# Patient Record
Sex: Female | Born: 1970
Health system: Southern US, Community
[De-identification: ages and names within clinical notes are randomized; demographics above are authoritative.]

## PROBLEM LIST (undated history)

## (undated) DIAGNOSIS — T783XXA Angioneurotic edema, initial encounter: Secondary | ICD-10-CM

## (undated) DIAGNOSIS — L509 Urticaria, unspecified: Secondary | ICD-10-CM

## (undated) DIAGNOSIS — N3010 Interstitial cystitis (chronic) without hematuria: Secondary | ICD-10-CM

## (undated) DIAGNOSIS — M2242 Chondromalacia patellae, left knee: Secondary | ICD-10-CM

## (undated) DIAGNOSIS — J42 Unspecified chronic bronchitis: Secondary | ICD-10-CM

## (undated) DIAGNOSIS — J45909 Unspecified asthma, uncomplicated: Secondary | ICD-10-CM

## (undated) DIAGNOSIS — E7800 Pure hypercholesterolemia, unspecified: Secondary | ICD-10-CM

## (undated) DIAGNOSIS — R011 Cardiac murmur, unspecified: Secondary | ICD-10-CM

## (undated) DIAGNOSIS — M2241 Chondromalacia patellae, right knee: Secondary | ICD-10-CM

## (undated) DIAGNOSIS — G43909 Migraine, unspecified, not intractable, without status migrainosus: Secondary | ICD-10-CM

## (undated) HISTORY — PX: APPENDECTOMY: SHX54

## (undated) HISTORY — DX: Pure hypercholesterolemia, unspecified: E78.00

## (undated) HISTORY — DX: Chondromalacia patellae, left knee: M22.41

## (undated) HISTORY — PX: AUGMENTATION MAMMAPLASTY: SUR837

## (undated) HISTORY — DX: Interstitial cystitis (chronic) without hematuria: N30.10

## (undated) HISTORY — PX: TONSILLECTOMY: SUR1361

## (undated) HISTORY — PX: SINOSCOPY: SHX187

## (undated) HISTORY — DX: Chondromalacia patellae, left knee: M22.42

## (undated) HISTORY — DX: Angioneurotic edema, initial encounter: T78.3XXA

## (undated) HISTORY — DX: Urticaria, unspecified: L50.9

---

## 1999-05-30 HISTORY — PX: ABDOMINAL HYSTERECTOMY: SHX81

## 2010-05-29 HISTORY — PX: NASAL SEPTUM SURGERY: SHX37

## 2016-02-08 ENCOUNTER — Ambulatory Visit (INDEPENDENT_AMBULATORY_CARE_PROVIDER_SITE_OTHER): Payer: BLUE CROSS/BLUE SHIELD | Admitting: Allergy

## 2016-02-08 ENCOUNTER — Encounter: Payer: Self-pay | Admitting: Allergy

## 2016-02-08 VITALS — BP 130/82 | HR 80 | Temp 98.9°F | Resp 18 | Ht 66.14 in | Wt 145.6 lb

## 2016-02-08 DIAGNOSIS — T781XXA Other adverse food reactions, not elsewhere classified, initial encounter: Secondary | ICD-10-CM | POA: Diagnosis not present

## 2016-02-08 DIAGNOSIS — J309 Allergic rhinitis, unspecified: Secondary | ICD-10-CM

## 2016-02-08 DIAGNOSIS — H1010 Acute atopic conjunctivitis, unspecified eye: Secondary | ICD-10-CM

## 2016-02-08 DIAGNOSIS — L509 Urticaria, unspecified: Secondary | ICD-10-CM | POA: Diagnosis not present

## 2016-02-08 DIAGNOSIS — T783XXA Angioneurotic edema, initial encounter: Secondary | ICD-10-CM

## 2016-02-08 MED ORDER — FEXOFENADINE HCL 180 MG PO TABS: 180.0000 mg | ORAL_TABLET | Freq: Every day | ORAL | 5 refills | Status: AC

## 2016-02-08 NOTE — Patient Instructions (Addendum)
Hives and Swelling   - will obtain following labs: hive panel, tryptase, CBC w diff, CMP  - please let us know if you have the following with your hives/swelling:  Leaves marks/bruises, fevers, joint aches/pains  - start Allegra 180 mg daily  - if you still have hives with daily Allegra would add Zantac 150mg  daily.  You may take this regimen twice a day if hives/swelling are not improving  Seasonal allergies  - will obtain environmental allergen panel and total IgE  - continue as needed use of Allegra  - may benefit from nasal spray or allergy eye drop  Oral pollen food syndrome   - continue avoidance of cantaloupe, watermelon and bananas  - will obtain IgE levels for the above  Possible contact dermatitis  - maybe from shampoo  - would recommend patch testing in future to evaluate  Follow-up 2-3 months

## 2016-02-08 NOTE — Progress Notes (Signed)
New Patient Note  RE: Christine Guerrero MRN: [id number] DOB: 1970/06/16 Date of Office Visit: 02/08/2016  Referring provider: No ref. provider found Primary care provider: Bernita Raisin., MD  Chief Complaint: allergic reaction  History of present illness: Christine Guerrero is a 45 y.o. female presenting today for consultation for allergic reaction. She was referred to see allergy after her recent ED visit (records not available in EMR).   She has been having hives and swelling.  She reports eating cookie batter and brownie batter on evening of Aug 25th and her lip started itching and swelling.  She took a benadryl and it improved.  She then had another incident on Aug 28th where she used a shampoo that she has used on many occasions and her scalp started itching.  She states her scalp itched for 2 days and on the 3rd day she started having hives and then in the evening she felt her mouth was itchy and then started swelling.  She did note her throat feeling scratchy.  She went to ED where she was treated with steroids, benadryl and discharged with 10 days of steroids.  She was still having episodes of hives while on steroids but reports the hives were not as bad.   With both episodes she denies any difficulty breathing, nausea or vomiting and no lightheadedness or syncope.  She had never had hives before.  She denies any preceding illnesses, sneezing, new medications or dosage changes, change in lotion/soaps/detergents.  She also denies any new foods. She reports that the hives for itchy, red and raised and will likely well. The holidays did not leave any marks or bruising. They resolved within 24 hours. She denies any joint pain or arthralgias.  She reports with certain fruits her throat itches: cantaloupe, watermelon, bananas.  The she avoids these foods.  She has interstitial cystitis and reports she is suppose to limit her fruit ingestion.   She reports runny nose and congestion, itchy  watery eyes and sneezing with spring being worse followed by fall.  She usually will take allegra D.    Had childhood asthma that she outgrew. Denies any history of eczema or drug allergy.  Review of systems: Review of Systems  Constitutional: Negative for chills and fever.  HENT: Negative for congestion and sore throat.   Eyes: Negative for redness.  Respiratory: Negative for cough, shortness of breath and wheezing.   Cardiovascular: Negative for chest pain.  Gastrointestinal: Negative for heartburn, nausea and vomiting.  Skin: Positive for itching and rash.  Neurological: Negative for headaches.    All other systems negative unless noted above in HPI  Past medical history: Past Medical History:  Diagnosis Date  . Angio-edema   . Bursitis of knee   . Chondromalacia of both patellae   . High cholesterol   . Interstitial cystitis   . Urticaria     Past surgical history: Past Surgical History:  Procedure Laterality Date  . ABDOMINAL HYSTERECTOMY  2001  . NASAL SEPTUM SURGERY  2012  . SINOSCOPY    . TONSILLECTOMY      Family history:  Family History  Problem Relation Age of Onset  . Melanoma Mother   . High blood pressure Mother   . High Cholesterol Mother   . Asthma Father   . Lung cancer Father   . Heart disease Father   . High blood pressure Father   . Diabetes Father   . Allergies Father     Seasonal  .  Crohn's disease Maternal Grandfather   . Lung cancer Paternal Grandfather   . Diabetes Paternal Grandfather     Social history: . She is married and lives in a home with carpeting in the family. Home has electric heating and central cooling. There is one dog in the home. She is a Futures traderhomemaker. She denies any tobacco use.     Medication List:   Medication List       Accurate as of 02/08/16 11:54 AM. Always use your most recent med list.          atorvastatin 10 MG tablet Commonly known as:  LIPITOR Take 1 tablet by mouth every other day.     diclofenac 50 MG tablet Commonly known as:  CATAFLAM Take by mouth.   estradiol 1 MG tablet Commonly known as:  ESTRACE Take 1 tablet by mouth daily.   KRILL OIL PO Take 1 capsule by mouth daily.   sertraline 100 MG tablet Commonly known as:  ZOLOFT Take 50 mg by mouth daily.       Known medication allergies: Allergies no known allergies   Physical examination: Blood pressure 130/82, pulse 80, temperature 98.9 F (37.2 C), temperature source Oral, resp. rate 18, height 5' 6.14" (1.68 m), weight 145 lb 9.6 oz (66 kg).   General: Alert, interactive, in no acute distress. HEENT: TMs pearly gray, turbinates minimally edematous without discharge, post-pharynx non erythematous. Neck: Supple without lymphadenopathy. Lungs: Clear to auscultation without wheezing, rhonchi or rales. {no increased work of breathing. CV: Normal S1, S2 without murmurs. Abdomen: Nondistended, nontender. Skin: Warm and dry, without lesions or rashes. No urticarial lesions or swelling. Extremities:  No clubbing, cyanosis or edema. Neuro:   Grossly intact.  Diagnositics/Labs: Allergy testing: Deferred due to recent hives and swelling within the past 6 weeks    Assessment and plan:   Urticaria with angioedema  - Likely idiopathic. Given that she eats egg and dairy products on a routine basis without any problem do not feel she was reacting to the cookie or brownie batters.    - will obtain following labs: hive panel, tryptase, CBC w diff, CMP  - please let us know if you have the following with your hives/swelling:  Leaves marks/bruises, fevers, joint aches/pains  - start Allegra 180 mg daily.    If she continues to have hives advised that she add on Zantac 150 mg daily. If she continues to have hives despite this regimen can increase both to twice a day dosing.    Allergic rhinoconjunctivitis  - will obtain environmental allergen panel and total IgE  - continue as needed use of Allegra  - may  benefit from nasal spray or allergy eye drop however she would like to wait to try these  Oral pollen food syndrome   - continue avoidance of cantaloupe, watermelon and bananas  - Suspect she has pollen allergy  - will obtain IgE levels for the fruits above  Possible contact dermatitis  - maybe from shampoo  - would recommend patch testing in future to evaluate off any steroid use  Follow-up 2-3 months  I appreciate the opportunity to take part in Christine Guerrero's care. Please do not hesitate to contact me with questions.  Sincerely,   Margo AyeShaylar Jeaneen Cala, MD Allergy/Immunology [facility]

## 2016-02-15 LAB — CBC WITH DIFFERENTIAL/PLATELET
BASOS ABS: 0 10*3/uL (ref 0.0–0.2)
BASOS: 0 %
EOS (ABSOLUTE): 0.2 10*3/uL (ref 0.0–0.4)
Eos: 3 %
Hematocrit: 38.7 % (ref 34.0–46.6)
Hemoglobin: 12.8 g/dL (ref 11.1–15.9)
IMMATURE GRANS (ABS): 0 10*3/uL (ref 0.0–0.1)
IMMATURE GRANULOCYTES: 0 %
LYMPHS: 37 %
Lymphocytes Absolute: 2 10*3/uL (ref 0.7–3.1)
MCH: 29.3 pg (ref 26.6–33.0)
MCHC: 33.1 g/dL (ref 31.5–35.7)
MCV: 89 fL (ref 79–97)
MONOS ABS: 0.3 10*3/uL (ref 0.1–0.9)
Monocytes: 6 %
NEUTROS PCT: 54 %
Neutrophils Absolute: 2.8 10*3/uL (ref 1.4–7.0)
PLATELETS: 279 10*3/uL (ref 150–379)
RBC: 4.37 x10E6/uL (ref 3.77–5.28)
RDW: 13.1 % (ref 12.3–15.4)
WBC: 5.3 10*3/uL (ref 3.4–10.8)

## 2016-02-15 LAB — ALLERGENS W/TOTAL IGE AREA 2
Bermuda Grass IgE: 0.48 kU/L — AB
Cat Dander IgE: 1.31 kU/L — AB
Cockroach, German IgE: <0.1 kU/L
Cottonwood IgE: 0.31 kU/L — AB
D Farinae IgE: 2.44 kU/L — AB
D001-IGE D PTERONYSSINUS: 2.76 kU/L — AB
Dog Dander IgE: 5.03 kU/L — AB
Elm, American IgE: 0.42 kU/L — AB
G010-IGE JOHNSON GRASS: 1 kU/L — AB
IGE (IMMUNOGLOBULIN E), SERUM: 157 [IU]/mL — AB (ref 0–100)
M002-IGE CLADOSPORIUM HERBARUM: 0.29 kU/L — AB
M003-IGE ASPERGILLUS FUMIGATUS: 0.63 kU/L — AB
M006-IGE ALTERNARIA ALTERNATA: 5.1 kU/L — AB
Oak, White IgE: 0.26 kU/L — AB
Pecan, Hickory IgE: 0.15 kU/L — AB
Penicillium Chrysogen IgE: 0.23 kU/L — AB
T001-IGE MAPLE/BOX ELDER: 0.19 kU/L — AB
T003-IGE COMMON SILVER BIRCH: 0.19 kU/L — AB
T006-IGE CEDAR, MOUNTAIN: 0.31 kU/L — AB
Timothy Grass IgE: 4.18 kU/L — AB
W001-IGE RAGWEED, SHORT: 0.17 kU/L — AB
W014-IGE PIGWEED, ROUGH: 0.48 kU/L — AB

## 2016-02-15 LAB — F087-IGE MELON

## 2016-02-15 LAB — COMPREHENSIVE METABOLIC PANEL
A/G RATIO: 1.9 (ref 1.2–2.2)
ALT: 39 IU/L — AB (ref 0–32)
AST: 24 IU/L (ref 0–40)
Albumin: 4.5 g/dL (ref 3.5–5.5)
Alkaline Phosphatase: 65 IU/L (ref 39–117)
BILIRUBIN TOTAL: 0.8 mg/dL (ref 0.0–1.2)
BUN/Creatinine Ratio: 21 (ref 9–23)
BUN: 15 mg/dL (ref 6–24)
CALCIUM: 9.5 mg/dL (ref 8.7–10.2)
CHLORIDE: 101 mmol/L (ref 96–106)
CO2: 24 mmol/L (ref 18–29)
Creatinine, Ser: 0.72 mg/dL (ref 0.57–1.00)
GFR, EST AFRICAN AMERICAN: 118 mL/min/{1.73_m2} (ref >59)
GFR, EST NON AFRICAN AMERICAN: 102 mL/min/{1.73_m2} (ref >59)
GLUCOSE: 92 mg/dL (ref 65–99)
Globulin, Total: 2.4 g/dL (ref 1.5–4.5)
POTASSIUM: 4.1 mmol/L (ref 3.5–5.2)
Sodium: 140 mmol/L (ref 134–144)
TOTAL PROTEIN: 6.9 g/dL (ref 6.0–8.5)

## 2016-02-15 LAB — ALLERGEN BANANA: Allergen Banana IgE: 0.21 kU/L — AB

## 2016-02-15 LAB — ALLERGEN WATERMELON: Allergen Watermelon IgE: <0.1 kU/L

## 2016-02-15 LAB — CHRONIC URTICARIA: CU INDEX: 4.5 (ref <10)

## 2016-02-15 LAB — TRYPTASE: TRYPTASE: 4.4 ug/L (ref 2.2–13.2)

## 2016-03-14 DIAGNOSIS — J0100 Acute maxillary sinusitis, unspecified: Secondary | ICD-10-CM | POA: Diagnosis not present

## 2016-03-14 DIAGNOSIS — J302 Other seasonal allergic rhinitis: Secondary | ICD-10-CM | POA: Diagnosis not present

## 2016-04-11 ENCOUNTER — Ambulatory Visit: Payer: Self-pay | Admitting: Allergy

## 2016-10-30 DIAGNOSIS — H52222 Regular astigmatism, left eye: Secondary | ICD-10-CM | POA: Diagnosis not present

## 2016-11-01 DIAGNOSIS — J0100 Acute maxillary sinusitis, unspecified: Secondary | ICD-10-CM | POA: Diagnosis not present

## 2016-11-28 DIAGNOSIS — Z6822 Body mass index (BMI) 22.0-22.9, adult: Secondary | ICD-10-CM | POA: Diagnosis not present

## 2016-11-28 DIAGNOSIS — Z1211 Encounter for screening for malignant neoplasm of colon: Secondary | ICD-10-CM | POA: Diagnosis not present

## 2016-11-28 DIAGNOSIS — Z01419 Encounter for gynecological examination (general) (routine) without abnormal findings: Secondary | ICD-10-CM | POA: Diagnosis not present

## 2016-12-08 DIAGNOSIS — E7800 Pure hypercholesterolemia, unspecified: Secondary | ICD-10-CM | POA: Diagnosis not present

## 2017-01-16 ENCOUNTER — Inpatient Hospital Stay (HOSPITAL_COMMUNITY)
Admission: EM | Admit: 2017-01-16 | Discharge: 2017-01-25 | DRG: 165 | Disposition: A | Discharge status: Home / Self Care | Payer: BLUE CROSS/BLUE SHIELD | Attending: Cardiothoracic Surgery | Admitting: Cardiothoracic Surgery

## 2017-01-16 ENCOUNTER — Emergency Department (HOSPITAL_COMMUNITY): Payer: BLUE CROSS/BLUE SHIELD

## 2017-01-16 ENCOUNTER — Encounter (HOSPITAL_COMMUNITY): Payer: Self-pay

## 2017-01-16 DIAGNOSIS — E7800 Pure hypercholesterolemia, unspecified: Secondary | ICD-10-CM | POA: Diagnosis not present

## 2017-01-16 DIAGNOSIS — Z885 Allergy status to narcotic agent status: Secondary | ICD-10-CM

## 2017-01-16 DIAGNOSIS — Z4682 Encounter for fitting and adjustment of non-vascular catheter: Secondary | ICD-10-CM | POA: Diagnosis not present

## 2017-01-16 DIAGNOSIS — J9383 Other pneumothorax: Secondary | ICD-10-CM

## 2017-01-16 DIAGNOSIS — R0602 Shortness of breath: Secondary | ICD-10-CM | POA: Diagnosis not present

## 2017-01-16 DIAGNOSIS — T783XXA Angioneurotic edema, initial encounter: Secondary | ICD-10-CM | POA: Diagnosis not present

## 2017-01-16 DIAGNOSIS — Z833 Family history of diabetes mellitus: Secondary | ICD-10-CM | POA: Diagnosis not present

## 2017-01-16 DIAGNOSIS — T8182XA Emphysema (subcutaneous) resulting from a procedure, initial encounter: Secondary | ICD-10-CM | POA: Diagnosis not present

## 2017-01-16 DIAGNOSIS — J939 Pneumothorax, unspecified: Secondary | ICD-10-CM

## 2017-01-16 DIAGNOSIS — R51 Headache: Secondary | ICD-10-CM | POA: Diagnosis not present

## 2017-01-16 DIAGNOSIS — Z09 Encounter for follow-up examination after completed treatment for conditions other than malignant neoplasm: Secondary | ICD-10-CM

## 2017-01-16 DIAGNOSIS — Z79899 Other long term (current) drug therapy: Secondary | ICD-10-CM

## 2017-01-16 DIAGNOSIS — Z9689 Presence of other specified functional implants: Secondary | ICD-10-CM

## 2017-01-16 DIAGNOSIS — Z90710 Acquired absence of both cervix and uterus: Secondary | ICD-10-CM

## 2017-01-16 DIAGNOSIS — R279 Unspecified lack of coordination: Secondary | ICD-10-CM | POA: Diagnosis not present

## 2017-01-16 DIAGNOSIS — Z419 Encounter for procedure for purposes other than remedying health state, unspecified: Secondary | ICD-10-CM

## 2017-01-16 DIAGNOSIS — J9311 Primary spontaneous pneumothorax: Secondary | ICD-10-CM | POA: Diagnosis not present

## 2017-01-16 DIAGNOSIS — Z8249 Family history of ischemic heart disease and other diseases of the circulatory system: Secondary | ICD-10-CM

## 2017-01-16 DIAGNOSIS — N3010 Interstitial cystitis (chronic) without hematuria: Secondary | ICD-10-CM | POA: Diagnosis not present

## 2017-01-16 DIAGNOSIS — Y838 Other surgical procedures as the cause of abnormal reaction of the patient, or of later complication, without mention of misadventure at the time of the procedure: Secondary | ICD-10-CM | POA: Diagnosis not present

## 2017-01-16 DIAGNOSIS — J45998 Other asthma: Secondary | ICD-10-CM | POA: Diagnosis not present

## 2017-01-16 DIAGNOSIS — J9382 Other air leak: Secondary | ICD-10-CM | POA: Diagnosis present

## 2017-01-16 DIAGNOSIS — Z743 Need for continuous supervision: Secondary | ICD-10-CM | POA: Diagnosis not present

## 2017-01-16 DIAGNOSIS — L509 Urticaria, unspecified: Secondary | ICD-10-CM | POA: Diagnosis not present

## 2017-01-16 DIAGNOSIS — J439 Emphysema, unspecified: Secondary | ICD-10-CM | POA: Diagnosis not present

## 2017-01-16 HISTORY — DX: Cardiac murmur, unspecified: R01.1

## 2017-01-16 HISTORY — PX: CHEST TUBE INSERTION: SHX231

## 2017-01-16 HISTORY — DX: Unspecified chronic bronchitis: J42

## 2017-01-16 HISTORY — DX: Migraine, unspecified, not intractable, without status migrainosus: G43.909

## 2017-01-16 HISTORY — DX: Unspecified asthma, uncomplicated: J45.909

## 2017-01-16 LAB — CBC
HCT: 39.2 % (ref 36.0–46.0)
HEMOGLOBIN: 13.2 g/dL (ref 12.0–15.0)
MCH: 28.9 pg (ref 26.0–34.0)
MCHC: 33.7 g/dL (ref 30.0–36.0)
MCV: 85.8 fL (ref 78.0–100.0)
Platelets: 236 10*3/uL (ref 150–400)
RBC: 4.57 MIL/uL (ref 3.87–5.11)
RDW: 13.3 % (ref 11.5–15.5)
WBC: 7 10*3/uL (ref 4.0–10.5)

## 2017-01-16 LAB — COMPREHENSIVE METABOLIC PANEL
ALBUMIN: 3.9 g/dL (ref 3.5–5.0)
ALK PHOS: 59 U/L (ref 38–126)
ALT: 22 U/L (ref 14–54)
ANION GAP: 8 (ref 5–15)
AST: 24 U/L (ref 15–41)
BUN: 8 mg/dL (ref 6–20)
CALCIUM: 8.6 mg/dL — AB (ref 8.9–10.3)
CHLORIDE: 108 mmol/L (ref 101–111)
CO2: 22 mmol/L (ref 22–32)
Creatinine, Ser: 0.67 mg/dL (ref 0.44–1.00)
GFR calc Af Amer: >60 mL/min (ref >60)
GFR calc non Af Amer: >60 mL/min (ref >60)
GLUCOSE: 150 mg/dL — AB (ref 65–99)
Potassium: 3.9 mmol/L (ref 3.5–5.1)
SODIUM: 138 mmol/L (ref 135–145)
Total Bilirubin: 0.6 mg/dL (ref 0.3–1.2)
Total Protein: 6.5 g/dL (ref 6.5–8.1)

## 2017-01-16 MED ORDER — DEXTROSE-NACL 5-0.9 % IV SOLN
INTRAVENOUS | Status: DC
  Administered 2017-01-16 – 2017-01-18 (×2): via INTRAVENOUS

## 2017-01-16 MED ORDER — ALBUTEROL SULFATE (2.5 MG/3ML) 0.083% IN NEBU
2.5000 mg | INHALATION_SOLUTION | Freq: Four times a day (QID) | RESPIRATORY_TRACT | Status: DC
  Administered 2017-01-16 – 2017-01-17 (×3): 2.5 mg via RESPIRATORY_TRACT
  Filled 2017-01-16 (×3): qty 3

## 2017-01-16 MED ORDER — ALBUTEROL SULFATE (2.5 MG/3ML) 0.083% IN NEBU: 2.5000 mg | INHALATION_SOLUTION | RESPIRATORY_TRACT | Status: DC | PRN

## 2017-01-16 MED ORDER — HYDROMORPHONE HCL 1 MG/ML IJ SOLN
1.0000 mg | Freq: Once | INTRAMUSCULAR | Status: AC
  Administered 2017-01-16: 1 mg via INTRAVENOUS
  Filled 2017-01-16: qty 1

## 2017-01-16 MED ORDER — SORBITOL 70 % SOLN: 30.0000 mL | Freq: Every day | Status: DC | PRN

## 2017-01-16 MED ORDER — DOCUSATE SODIUM 100 MG PO CAPS
100.0000 mg | ORAL_CAPSULE | Freq: Two times a day (BID) | ORAL | Status: DC
  Administered 2017-01-16 – 2017-01-21 (×9): 100 mg via ORAL
  Filled 2017-01-16 (×9): qty 1

## 2017-01-16 MED ORDER — HYDROMORPHONE HCL 1 MG/ML IJ SOLN
1.0000 mg | INTRAMUSCULAR | Status: DC | PRN
  Administered 2017-01-17 – 2017-01-19 (×7): 1 mg via INTRAVENOUS
  Filled 2017-01-16 (×7): qty 1

## 2017-01-16 MED ORDER — GUAIFENESIN ER 600 MG PO TB12
600.0000 mg | ORAL_TABLET | Freq: Two times a day (BID) | ORAL | Status: DC
  Administered 2017-01-16 – 2017-01-25 (×16): 600 mg via ORAL
  Filled 2017-01-16 (×16): qty 1

## 2017-01-16 MED ORDER — ENOXAPARIN SODIUM 40 MG/0.4ML ~~LOC~~ SOLN
40.0000 mg | SUBCUTANEOUS | Status: DC
Start: 2017-01-17 — End: 2017-01-22
  Administered 2017-01-17 – 2017-01-20 (×3): 40 mg via SUBCUTANEOUS
  Filled 2017-01-16 (×3): qty 0.4

## 2017-01-16 MED ORDER — KETOROLAC TROMETHAMINE 15 MG/ML IJ SOLN
15.0000 mg | Freq: Four times a day (QID) | INTRAMUSCULAR | Status: AC
  Administered 2017-01-16 – 2017-01-17 (×4): 15 mg via INTRAVENOUS
  Filled 2017-01-16 (×4): qty 1

## 2017-01-16 MED ORDER — TRAMADOL HCL 50 MG PO TABS
50.0000 mg | ORAL_TABLET | Freq: Four times a day (QID) | ORAL | Status: DC | PRN
  Administered 2017-01-16 – 2017-01-21 (×8): 50 mg via ORAL
  Filled 2017-01-16 (×9): qty 1

## 2017-01-16 MED ORDER — SODIUM CHLORIDE 0.9% FLUSH
3.0000 mL | Freq: Two times a day (BID) | INTRAVENOUS | Status: DC
  Administered 2017-01-16 – 2017-01-21 (×8): 3 mL via INTRAVENOUS

## 2017-01-16 MED ORDER — ASPIRIN EC 81 MG PO TBEC
81.0000 mg | DELAYED_RELEASE_TABLET | Freq: Every day | ORAL | Status: DC
  Administered 2017-01-16 – 2017-01-25 (×8): 81 mg via ORAL
  Filled 2017-01-16 (×8): qty 1

## 2017-01-16 NOTE — Consult Note (Signed)
[address]       [place] [address]             [phone]        Yanieliz Summerlin [facility] Medical Record #[id number] Date of Birth: 10/28/1970  Referring: No ref. provider found Primary Care: Bernita Raisin., MD  Chief Complaint:    Chief Complaint  Patient presents with  . Tension Pneumothorax  patient examined, chest x-ray images taken today all personally reviewed.  History of Present Illness:     46 year old Caucasian female nonsmoker who presented to [facility] ER with several days of sharp right sided chest pain and shortness of breath. She is found have a 50% spontaneous right pneumothorax. She had been having problems with asthmatic bronchitis with wheezing and cough the preceding days. No falls or trauma to the chest. This is her first episode of spontaneous pneumothorax.  A chest tube was placed in the emergency department by a surgeon. This was repositioned at least once and on x-ray taken at this hospital  the right lung is 90% reexpanded. The tube is secure, connected to a Pleur-evac, and there is an air leak present upon coughing.   Current Activity/ Functional Status: Married, normal activities   Zubrod Score: At the time of surgery this patient's most appropriate activity status/level should be described as: []     0    Normal activity, no symptoms []     1    Restricted in physical strenuous activity but ambulatory, able to do out light work [x]     2    Ambulatory and capable of self care, unable to do work activities, up and about                 more than 50%  Of the time                            []     3    Only limited self care, in bed greater than 50% of waking hours []     4    Completely disabled, no self care, confined to bed or chair []     5    Moribund  Past Medical History:  Diagnosis Date  . Angio-edema   . Bursitis of knee   . Chondromalacia of both patellae   . High cholesterol   . Interstitial cystitis    . Urticaria     Past Surgical History:  Procedure Laterality Date  . ABDOMINAL HYSTERECTOMY  2001  . NASAL SEPTUM SURGERY  2012  . SINOSCOPY    . TONSILLECTOMY      History  Smoking Status  . Never Smoker  Smokeless Tobacco  . Never Used   History  Alcohol Use No    Social History   Social History  . Marital status: Married    Spouse name: N/A  . Number of children: N/A  . Years of education: N/A   Occupational History  . Not on file.   Social History Main Topics  . Smoking status: Never Smoker  . Smokeless tobacco: Never Used  . Alcohol use No  . Drug use: No  . Sexual activity: Not on file   Other Topics Concern  . Not on file   Social History Narrative  . No narrative on file    Allergies  Allergen Reactions  . Codeine Nausea And Vomiting    Current Facility-Administered  Medications  Medication Dose Route Frequency Provider Last Rate Last Dose  . albuterol (PROVENTIL) (2.5 MG/3ML) 0.083% nebulizer solution 2.5 mg  2.5 mg Nebulization Q6H Donata Clay, Theron Arista, MD      . albuterol (PROVENTIL) (2.5 MG/3ML) 0.083% nebulizer solution 2.5 mg  2.5 mg Nebulization Q2H PRN Kerin Perna, MD      . aspirin EC tablet 81 mg  81 mg Oral Daily Donata Clay, Theron Arista, MD      . dextrose 5 %-0.9 % sodium chloride infusion   Intravenous Continuous Donata Clay, Theron Arista, MD      . docusate sodium (COLACE) capsule 100 mg  100 mg Oral BID Donata Clay, Theron Arista, MD      . Melene Muller ON 01/17/2017] enoxaparin (LOVENOX) injection 40 mg  40 mg Subcutaneous Q24H Donata Clay, Theron Arista, MD      . guaiFENesin [facility]) 12 hr tablet 600 mg  600 mg Oral BID Donata Clay, Theron Arista, MD      . HYDROmorphone (DILAUDID) injection 1 mg  1 mg Intravenous Q2H PRN Donata Clay, Theron Arista, MD      . ketorolac (TORADOL) 15 MG/ML injection 15 mg  15 mg Intravenous Q6H Donata Clay, Theron Arista, MD      . sodium chloride flush (NS) 0.9 % injection 3 mL  3 mL Intravenous Q12H Donata Clay, Theron Arista, MD      . sorbitol 70 % solution 30 mL  30 mL  Oral Daily PRN Donata Clay, Theron Arista, MD      . traMADol Janean Sark) tablet 50 mg  50 mg Oral Q6H PRN Kerin Perna, MD       Current Outpatient Prescriptions  Medication Sig Dispense Refill  . diclofenac (CATAFLAM) 50 MG tablet Take 50 mg by mouth daily.     Marland Kitchen estradiol (ESTRACE) 1 MG tablet Take 1 tablet by mouth daily.    . fexofenadine (ALLEGRA) 180 MG tablet Take 1 tablet (180 mg total) by mouth daily. 30 tablet 5  . omega-3 acid ethyl esters (LOVAZA) 1 g capsule Take 1 g by mouth 2 (two) times daily.    Marland Kitchen atorvastatin (LIPITOR) 10 MG tablet Take 1 tablet by mouth every other day.  5     (Not in a hospital admission)  Family History  Problem Relation Age of Onset  . Melanoma Mother   . High blood pressure Mother   . High Cholesterol Mother   . Asthma Father   . Lung cancer Father   . Heart disease Father   . High blood pressure Father   . Diabetes Father   . Allergies Father        Seasonal  . Crohn's disease Maternal Grandfather   . Lung cancer Paternal Grandfather   . Diabetes Paternal Grandfather      Review of Systems:       Cardiac Review of Systems: Y or N  Chest Pain [ y   ]  Resting SOB Cove.Etienne   ] Exertional SOB  Cove.Etienne  ]  Orthopnea [ y ]   Pedal Edema [ n  ]    Palpitations [n  ] Syncope  Milo.Brash  ]   Presyncope [ n  ]  General Review of Systems: [Y] = yes [  ]=no Constitional: recent weight change [  ]; anorexia [  ]; fatigue Cove.Etienne  ]; nausea [  ]; night sweats [  ]; fever [  ]; or chills [  ]  Dental: poor dentition[  ]; Last Dentist visit: 1 year  Eye : blurred vision [  ]; diplopia [   ]; vision changes [  ];  Amaurosis fugax[  ]; Resp: cough [  ];  wheezing[  ];  hemoptysis[  ]; shortness of breath[  ]; paroxysmal nocturnal dyspnea[  ]; dyspnea on exertion[y  ]; or orthopnea[  ];  GI:  gallstones[  ], vomiting[  ];  dysphagia[  ]; melena[  ];  hematochezia [  ]; heartburn[  ];   Hx of  Colonoscopy[  ]; GU: kidney  stones [  ]; hematuria[  ];   dysuria Cove.Etienne  ];  nocturia[  ];  history of     obstruction [  ]; urinary frequency [  ]             Skin: rash, swelling[  ];, hair loss[  ];  peripheral edema[  ];  or itching[  ]; Musculosketetal: myalgias[  ];  joint swelling[  ];  joint erythema[  ];  joint pain[  ];  back pain[ y ];  Heme/Lymph: bruising[  ];  bleeding[  ];  anemia[  ];  Neuro: TIA[  ];  headaches[ y ];  stroke[  ];  vertigo[  ];  seizures[  ];   paresthesias[  ];  difficulty walking[  ];  Psych:depression[  ]; anxiety[  ];  Endocrine: diabetes[  ];  thyroid dysfunction[  ];  Immunizations: Flu [  ]; Pneumococcal[  ];  Other: RHD  Physical Exam: BP 133/84   Pulse 69   Temp 98.1 F (36.7 C) (Oral)   Resp 13   Ht 5\' 6"  (1.676 m)   Wt 138 lb (62.6 kg)   SpO2 96%   BMI 22.27 kg/m        Exam    General- alert and comfortable   Lungs- clear without rales, wheezes, chest tube placed in right pleural space   Cor- regular rate and rhythm, no murmur , gallop   Abdomen- soft, non-tender   Extremities - warm, non-tender, minimal edema   Neuro- oriented, appropriate, no focal weakness    Diagnostic Studies & Laboratory data:     Recent Radiology Findings:   Dg Chest Port 1 View  Result Date: 01/16/2017 CLINICAL DATA:  Chest tube placement. EXAM: PORTABLE CHEST 1 VIEW COMPARISON:  None. FINDINGS: Right-sided chest tube overlies the lower hemithorax. Cardiomediastinal silhouette is normal. Mediastinal contours appear intact. Right apical pneumothorax occupies approximately 10% of the volume of the right hemithorax. There is no evidence of focal airspace consolidation, or pleural effusion. Osseous structures are without acute abnormality. Extensive right chest wall soft tissue emphysema. IMPRESSION: Right-sided pneumothorax of approximately 10% volume with chest tube in place. Electronically Signed   By: Ted Mcalpine M.D.   On: 01/16/2017 17:53     I have independently reviewed the  above radiologic studies.  Recent Lab Findings: Lab Results  Component Value Date   WBC 5.3 02/08/2016   HGB 12.8 02/08/2016   HCT 38.7 02/08/2016   PLT 279 02/08/2016   GLUCOSE 92 02/08/2016   ALT 39 (H) 02/08/2016   AST 24 02/08/2016   NA 140 02/08/2016   K 4.1 02/08/2016   CL 101 02/08/2016   CREATININE 0.72 02/08/2016   BUN 15 02/08/2016   CO2 24 02/08/2016      Assessment / Plan:      First episode of spontaneous pneumothorax-right lung Chest tube placed at outside hospital with  90% reexpansion of lung Patient needs admission to hospital for chest tube therapy, control pain and support of her asthmatic bronchitis. I discussed the plan of care the patient and her husband including chest tube therapy and possible need for VATS if her airleak does not resolve in the next 48 hours.    @ME1 @ 01/16/2017 7:02 PM

## 2017-01-16 NOTE — ED Provider Notes (Signed)
[facility]-EMERGENCY DEPT Provider Note   CSN: [id number] Arrival date & time: 01/16/17  1647     History   Chief Complaint Chief Complaint  Patient presents with  . Tension Pneumothorax    HPI Christine Guerrero is a 46 y.o. female.  46 year old female who presents with chest pain and shortness of breath. The patient presented to an outside hospital today after shoe has had 6 weeks of progressively worsening shortness of breath that she initially thought waswheezing. She has also had some sharp right-sided chest pain that has been worse for the past several days. She denies any history of chest trauma.Husband notes persistent cough over the past several weeks, no fevers or vomiting. She was seen in urgent care and sent immediately to the ER because a tension pneumothorax was noted on chest x-ray. In the ED, a chest tube was placed and she was transferred here for further care. She states she had asthma in childhood but denies any problems with asthma and adulthood. She has never had a spontaneous pneumothorax before. No anticoagulant use. Her pain is currently moderate to severe and constant at the site of the tube.   The history is provided by the patient.    Past Medical History:  Diagnosis Date  . Angio-edema   . Bursitis of knee   . Chondromalacia of both patellae   . High cholesterol   . Interstitial cystitis   . Urticaria     Patient Active Problem List   Diagnosis Date Noted  . Pneumothorax on right 01/16/2017  . Urticaria 02/08/2016  . Angioedema 02/08/2016    Past Surgical History:  Procedure Laterality Date  . ABDOMINAL HYSTERECTOMY  2001  . NASAL SEPTUM SURGERY  2012  . SINOSCOPY    . TONSILLECTOMY      OB History    No data available       Home Medications    Prior to Admission medications   Medication Sig Start Date End Date Taking? Authorizing Provider  diclofenac (CATAFLAM) 50 MG tablet Take 50 mg by mouth daily.    Yes [provider]    estradiol (ESTRACE) 1 MG tablet Take 1 tablet by mouth daily.   Yes [provider]  fexofenadine (ALLEGRA) 180 MG tablet Take 1 tablet (180 mg total) by mouth daily. 02/08/16  Yes Padgett, Pilar Grammes, MD  omega-3 acid ethyl esters (LOVAZA) 1 g capsule Take 1 g by mouth 2 (two) times daily.   Yes [provider]  atorvastatin (LIPITOR) 10 MG tablet Take 1 tablet by mouth every other day. 12/29/15   [provider]    Family History Family History  Problem Relation Age of Onset  . Melanoma Mother   . High blood pressure Mother   . High Cholesterol Mother   . Asthma Father   . Lung cancer Father   . Heart disease Father   . High blood pressure Father   . Diabetes Father   . Allergies Father        Seasonal  . Crohn's disease Maternal Grandfather   . Lung cancer Paternal Grandfather   . Diabetes Paternal Grandfather     Social History Social History  Substance Use Topics  . Smoking status: Never Smoker  . Smokeless tobacco: Never Used  . Alcohol use No     Allergies   Codeine   Review of Systems Review of Systems All other systems reviewed and are negative except that which was mentioned in HPI   Physical  Exam Updated Vital Signs BP 128/86   Pulse 71   Temp 98.1 F (36.7 C) (Oral)   Resp 14   Ht 5\' 6"  (1.676 m)   Wt 62.6 kg (138 lb)   SpO2 96%   BMI 22.27 kg/m   Physical Exam  Constitutional: She is oriented to person, place, and time. She appears well-developed and well-nourished. No distress.  HENT:  Head: Normocephalic and atraumatic.  Eyes: Conjunctivae are normal.  Neck: Neck supple.  Cardiovascular: Normal rate, regular rhythm and normal heart sounds.   No murmur heard. Pulmonary/Chest:  Pain with inspiration; diminished BS and crackles R lung; chest tube in place R mid-axillary line   Abdominal: Soft. Bowel sounds are normal. She exhibits no distension. There is no tenderness.  Musculoskeletal: She exhibits no  edema.  Neurological: She is alert and oriented to person, place, and time.  Fluent speech  Skin: Skin is warm and dry.  Psychiatric: She has a normal mood and affect. Judgment normal.  Nursing note and vitals reviewed.    ED Treatments / Results  Labs (all labs ordered are listed, but only abnormal results are displayed) Labs Reviewed  COMPREHENSIVE METABOLIC PANEL - Abnormal; Notable for the following:       Result Value   Glucose, Bld 150 (*)    Calcium 8.6 (*)    All other components within normal limits  CBC  HIV ANTIBODY (ROUTINE TESTING)  BASIC METABOLIC PANEL  CBC    EKG  EKG Interpretation None       Radiology Dg Chest Port 1 View  Result Date: 01/16/2017 CLINICAL DATA:  Chest tube placement. EXAM: PORTABLE CHEST 1 VIEW COMPARISON:  None. FINDINGS: Right-sided chest tube overlies the lower hemithorax. Cardiomediastinal silhouette is normal. Mediastinal contours appear intact. Right apical pneumothorax occupies approximately 10% of the volume of the right hemithorax. There is no evidence of focal airspace consolidation, or pleural effusion. Osseous structures are without acute abnormality. Extensive right chest wall soft tissue emphysema. IMPRESSION: Right-sided pneumothorax of approximately 10% volume with chest tube in place. Electronically Signed   By: Ted Mcalpine M.D.   On: 01/16/2017 17:53    Procedures .Critical Care Performed by: Laurence Spates Authorized by: Laurence Spates   Critical care provider statement:    Critical care time (minutes):  30   Critical care time was exclusive of:  Separately billable procedures and treating other patients   Critical care was necessary to treat or prevent imminent or life-threatening deterioration of the following conditions:  Respiratory failure   Critical care was time spent personally by me on the following activities:  Development of treatment plan with patient or surrogate, discussions with  consultants, evaluation of patient's response to treatment, examination of patient, obtaining history from patient or surrogate, ordering and performing treatments and interventions, ordering and review of laboratory studies, ordering and review of radiographic studies, re-evaluation of patient's condition and pulse oximetry   (including critical care time)  Medications Ordered in ED Medications  HYDROmorphone (DILAUDID) injection 1 mg (not administered)  ketorolac (TORADOL) 15 MG/ML injection 15 mg (15 mg Intravenous Given 01/16/17 1903)  enoxaparin (LOVENOX) injection 40 mg (not administered)  sodium chloride flush (NS) 0.9 % injection 3 mL (not administered)  dextrose 5 %-0.9 % sodium chloride infusion (not administered)  traMADol (ULTRAM) tablet 50 mg (not administered)  docusate sodium (COLACE) capsule 100 mg (not administered)  sorbitol 70 % solution 30 mL (not administered)  aspirin EC tablet 81 mg (not  administered)  guaiFENesin (MUCINEX) 12 hr tablet 600 mg (not administered)  albuterol (PROVENTIL) (2.5 MG/3ML) 0.083% nebulizer solution 2.5 mg (not administered)  albuterol (PROVENTIL) (2.5 MG/3ML) 0.083% nebulizer solution 2.5 mg (not administered)  HYDROmorphone (DILAUDID) injection 1 mg (1 mg Intravenous Given 01/16/17 1836)     Initial Impression / Assessment and Plan / ED Course  I have reviewed the triage vital signs and the nursing notes.  Pertinent labs & imaging results that were available during my care of the patient were reviewed by me and considered in my medical decision making (see chart for details).    Pt transferred from OSH after she was found to have spontaneous pneumothorax and chest tube was placed prior to arrival. On arrival to the ED, she was stable with normal vital signs, O2 saturation 98-99% on room air. She was having pain related to the chest tube. Pleuravac placed on suction, air leak noted. Contact a thoracic surgery and discussed with Dr. Morton Peters.  He will admit patient for further care. Final Clinical Impressions(s) / ED Diagnoses   Final diagnoses:  Primary spontaneous pneumothorax    New Prescriptions New Prescriptions   No medications on file     Selena Swaminathan, Ambrose Finland, MD 01/16/17 [phone]

## 2017-01-16 NOTE — ED Notes (Signed)
Pt was given 12.5 mg of Versed, 150 mcg of Fentanyl, 4 mg of Zofran, and 1 mg of Dilaudid before transport.

## 2017-01-16 NOTE — ED Triage Notes (Signed)
Per [facility], Pt is coming from [facility] with diagnosis of Tension Pneumothorax. Pt reports having two weeks of increased SOB that has worsened over the last two days. Pt was seen at [facility] and sent to the hospital for chest tube. Pt has chest tube in right chest with no drainage noted. Vitals per EMS: 131/68, 96% on RA, 62 HR, 16 RR

## 2017-01-16 NOTE — ED Notes (Signed)
MD Donata Clay at the bedside

## 2017-01-17 ENCOUNTER — Inpatient Hospital Stay (HOSPITAL_COMMUNITY): Payer: BLUE CROSS/BLUE SHIELD

## 2017-01-17 LAB — HIV ANTIBODY (ROUTINE TESTING W REFLEX): HIV Screen 4th Generation wRfx: NONREACTIVE

## 2017-01-17 LAB — BASIC METABOLIC PANEL
Anion gap: 7 (ref 5–15)
BUN: 12 mg/dL (ref 6–20)
CO2: 21 mmol/L — ABNORMAL LOW (ref 22–32)
Calcium: 8.7 mg/dL — ABNORMAL LOW (ref 8.9–10.3)
Chloride: 107 mmol/L (ref 101–111)
Creatinine, Ser: 0.63 mg/dL (ref 0.44–1.00)
GFR calc Af Amer: >60 mL/min (ref >60)
GFR calc non Af Amer: >60 mL/min (ref >60)
Glucose, Bld: 150 mg/dL — ABNORMAL HIGH (ref 65–99)
Potassium: 4.3 mmol/L (ref 3.5–5.1)
Sodium: 135 mmol/L (ref 135–145)

## 2017-01-17 LAB — CBC
HCT: 37.1 % (ref 36.0–46.0)
Hemoglobin: 12.5 g/dL (ref 12.0–15.0)
MCH: 29.1 pg (ref 26.0–34.0)
MCHC: 33.7 g/dL (ref 30.0–36.0)
MCV: 86.5 fL (ref 78.0–100.0)
Platelets: 219 10*3/uL (ref 150–400)
RBC: 4.29 MIL/uL (ref 3.87–5.11)
RDW: 13.7 % (ref 11.5–15.5)
WBC: 9.2 10*3/uL (ref 4.0–10.5)

## 2017-01-17 NOTE — Progress Notes (Signed)
Known air leak documented per cardiothoracic surgury. Patient complaining of right shoulder pain. Crepitus border marked right shoulder/back. Patient's O2 sats and RR WNL. No complaints of SOB. Chest Tube drainage maintained at -20 cm dry suction. Output documented in I&O's and marked on container.

## 2017-01-17 NOTE — Plan of Care (Signed)
Problem: Pain Managment: Goal: General experience of comfort will improve Outcome: Progressing Pain controlled with current medications.

## 2017-01-17 NOTE — Progress Notes (Signed)
      [address]       [place] [address]             [phone]         Subjective: Some soreness in right shoulder region  Objective: Vital signs in last 24 hours: Temp:  [97.7 F (36.5 C)-98.2 F (36.8 C)] 97.7 F (36.5 C) (08/22 0024) Pulse Rate:  [54-72] 68 (08/22 0024) Cardiac Rhythm: Normal sinus rhythm (08/21 2208) Resp:  [12-21] 21 (08/22 0024) BP: (109-145)/(66-102) 109/66 (08/22 0024) SpO2:  [95 %-100 %] 97 % (08/22 0024) Weight:  [138 lb (62.6 kg)] 138 lb (62.6 kg) (08/21 1701)  Hemodynamic parameters for last 24 hours:    Intake/Output from previous day: 08/21 0701 - 08/22 0700 In: -  Out: 37 [Chest Tube:37] Intake/Output this shift: No intake/output data recorded.  General appearance: alert, cooperative and no distress Heart: regular rate and rhythm Lungs: milsly dim right base, no wheeze Abdomen: benign Extremities: no edema or cald tenderness  Lab Results:  Recent Labs  01/16/17 1834 01/17/17 0408  WBC 7.0 9.2  HGB 13.2 12.5  HCT 39.2 37.1  PLT 236 219   BMET:  Recent Labs  01/16/17 1834 01/17/17 0408  NA 138 135  K 3.9 4.3  CL 108 107  CO2 22 21*  GLUCOSE 150* 150*  BUN 8 12  CREATININE 0.67 0.63  CALCIUM 8.6* 8.7*    PT/INR: No results for input(s): LABPROT, INR in the last 72 hours. ABG No results found for: PHART, HCO3, TCO2, ACIDBASEDEF, O2SAT CBG (last 3)  No results for input(s): GLUCAP in the last 72 hours.  Meds Scheduled Meds: . albuterol  2.5 mg Nebulization Q6H  . aspirin EC  81 mg Oral Daily  . docusate sodium  100 mg Oral BID  . enoxaparin (LOVENOX) injection  40 mg Subcutaneous Q24H  . guaiFENesin  600 mg Oral BID  . ketorolac  15 mg Intravenous Q6H  . sodium chloride flush  3 mL Intravenous Q12H   Continuous Infusions: . dextrose 5 % and 0.9% NaCl 30 mL/hr at 01/16/17 2226   PRN Meds:.albuterol, HYDROmorphone (DILAUDID) injection, sorbitol, traMADol  Xrays Dg Chest Port 1  View  Result Date: 01/16/2017 CLINICAL DATA:  Chest tube placement. EXAM: PORTABLE CHEST 1 VIEW COMPARISON:  None. FINDINGS: Right-sided chest tube overlies the lower hemithorax. Cardiomediastinal silhouette is normal. Mediastinal contours appear intact. Right apical pneumothorax occupies approximately 10% of the volume of the right hemithorax. There is no evidence of focal airspace consolidation, or pleural effusion. Osseous structures are without acute abnormality. Extensive right chest wall soft tissue emphysema. IMPRESSION: Right-sided pneumothorax of approximately 10% volume with chest tube in place. Electronically Signed   By: Ted Mcalpine M.D.   On: 01/16/2017 17:53    Chest tube- pos. Air leak  Assessment/Plan:  1 stable with CT to suction, small pntx , + air leak- cont current management. May need chest CT for further evaluation/management strategy 2 routine pulm toilet 3 pain management 4 hopefully can avoid surgery, but time will tell  LOS: 1 day    GOLD,WAYNE E 01/17/2017

## 2017-01-18 ENCOUNTER — Encounter (HOSPITAL_COMMUNITY): Payer: Self-pay | Admitting: Anesthesiology

## 2017-01-18 ENCOUNTER — Inpatient Hospital Stay (HOSPITAL_COMMUNITY): Payer: BLUE CROSS/BLUE SHIELD

## 2017-01-18 LAB — GLUCOSE, CAPILLARY: Glucose-Capillary: 86 mg/dL (ref 65–99)

## 2017-01-18 MED ORDER — PROMETHAZINE HCL 25 MG/ML IJ SOLN
12.5000 mg | Freq: Four times a day (QID) | INTRAMUSCULAR | Status: DC | PRN
  Administered 2017-01-18: 12.5 mg via INTRAVENOUS
  Filled 2017-01-18: qty 1

## 2017-01-18 MED ORDER — DICLOFENAC SODIUM 50 MG PO TBEC
50.0000 mg | DELAYED_RELEASE_TABLET | Freq: Every day | ORAL | Status: DC
  Administered 2017-01-18 – 2017-01-21 (×3): 50 mg via ORAL
  Filled 2017-01-18 (×5): qty 1

## 2017-01-18 MED ORDER — ONDANSETRON HCL 4 MG/2ML IJ SOLN
4.0000 mg | Freq: Four times a day (QID) | INTRAMUSCULAR | Status: DC | PRN
  Administered 2017-01-18: 4 mg via INTRAVENOUS
  Filled 2017-01-18: qty 2

## 2017-01-18 MED ORDER — LORATADINE 10 MG PO TABS
10.0000 mg | ORAL_TABLET | Freq: Every day | ORAL | Status: DC
  Administered 2017-01-18 – 2017-01-25 (×6): 10 mg via ORAL
  Filled 2017-01-18 (×6): qty 1

## 2017-01-18 MED ORDER — SORBITOL 70 % SOLN: 30.0000 mL | Freq: Every day | Status: DC | PRN

## 2017-01-18 MED ORDER — SORBITOL 70 % PO SOLN
30.0000 mL | Freq: Every day | ORAL | Status: DC | PRN
  Filled 2017-01-18: qty 30

## 2017-01-18 MED ORDER — ESTRADIOL 1 MG PO TABS
1.0000 mg | ORAL_TABLET | Freq: Every day | ORAL | Status: DC
  Administered 2017-01-18 – 2017-01-25 (×6): 1 mg via ORAL
  Filled 2017-01-18 (×10): qty 1

## 2017-01-18 NOTE — Discharge Summary (Signed)
Physician Discharge Summary       [address]       [place] [address]             [phone]    Patient ID: Christine Guerrero MRN: [id number] DOB/AGE: 06/27/1970 45 y.o.  Admit date: 01/16/2017 Discharge date: 01/25/2017  Admission Diagnoses: Spontaneous right pneumothorax  Active Diagnoses:  1.High cholesterol 2.Interstitial cystitis 3.Angio-edema 4.Chondromalacia of both patellae 5. Urticaria 6. Bursitis of knee  Procedure (s):  Right chest tube placed at [facility] on 01/16/2017 Right pigtail catheter placed in right chest on 01/20/2017 for recurrent pntx  DATE OF PROCEDURE:  01/22/2017 DATE OF DISCHARGE:                              OPERATIVE REPORT   OPERATION: 1. Right VATS (video-assisted thoracoscopic surgery) with stapling of     right upper lobe apical blebs. 2. Talc pleurodesis of the right pleural space.  SURGEON:  Kerin Perna, M.D.  ASSISTANT:  Rowe Clack, PA-C.  ANESTHESIA:  General.  PREOPERATIVE DIAGNOSIS:  Spontaneous right pneumothorax with prolonged air leak.  POSTOPERATIVE DIAGNOSIS:  Spontaneous right pneumothorax with prolonged air leak.  History of Presenting Illness: This is a 46 year old Caucasian female nonsmoker who presented to [facility] ER with several days of sharp right sided chest pain and shortness of breath. She was found have a 50% spontaneous right pneumothorax. She had been having problems with asthmatic bronchitis with wheezing and cough the preceding days. No falls or trauma to the chest. This is her first episode of spontaneous pneumothorax.  A chest tube was placed in the emergency department by a surgeon at [facility]. This was repositioned at least once and on x-ray taken at this hospital  the right lung is 90% reexpanded. The tube is secure, connected to a Pleur-evac, and there is an air leak present upon coughing.   Dr. Donata Clay accepted the patient in  transfer   Brief Hospital Course:  She remained afebrile and hemodynamically stable. Chest tube was on suction for a few days and had an air leak. Daily chest x rays were obtained and remained stable (small right apical pneumothorax). Chest tube was placed to water seal the evening of 08/22. Chest tube was removed on 08/22. Follow up chest x ray showed recurrent pneumothorax and a new chest tube was placed percutaneously (pigtail).  She subsequently was scheduled for right video-assisted thoracoscopic surgery by Dr. Donata Clay which was done on 01/18/2017. Please see the note below for full details. She tolerated this well and was taken to the postanesthesia care unit in stable condition.  Postoperative hospital course:  The patient has done quite well. The chest tube was managed in the usual fashion with transition from suction to water seal and finally, removal. She has maintained stable oxygenation on room air. Incision is noted to be healing well without evidence of infection. She is tolerating gradually increasing activities using standard protocols. At time of discharge she is felt to be quite stable.  Latest Vital Signs: Blood pressure 137/71, pulse 65, temperature 98.7 F (37.1 C), temperature source Oral, resp. rate 13, height 5\' 6"  (1.676 m), weight 138 lb 6.4 oz (62.8 kg), SpO2 98 %.  Physical Exam: General appearance: alert, cooperative and no distress Heart: regular rate and rhythm Lungs: clear to auscultation bilaterally Abdomen: soft, non-tender; bowel sounds normal; no masses,  no organomegaly Extremities: extremities  normal, atraumatic, no cyanosis or edema Wound: clean and dry  Discharge Condition:Stable and discharged to home.  Recent laboratory studies:  Lab Results  Component Value Date   WBC 6.5 01/24/2017   HGB 11.0 (L) 01/24/2017   HCT 32.7 (L) 01/24/2017   MCV 87.0 01/24/2017   PLT 192 01/24/2017   Lab Results  Component Value Date   NA 138 01/24/2017   K  3.8 01/24/2017   CL 108 01/24/2017   CO2 24 01/24/2017   CREATININE 0.62 01/24/2017   GLUCOSE 109 (H) 01/24/2017    Diagnostic Studies: Dg Chest Port 1 View  Result Date: 01/18/2017 CLINICAL DATA:  Right-sided chest tube, follow-up examination EXAM: PORTABLE CHEST 1 VIEW COMPARISON:  01/17/2017 FINDINGS: Cardiac shadow is stable. Elevation the right hemidiaphragm is again noted. Right chest tube is seen with stable apical pneumothorax. No new focal abnormality is noted. IMPRESSION: No change from the prior exam.  Stable right pneumothorax is noted Electronically Signed   By: Alcide Clever M.D.   On: 01/18/2017 08:11    Discharge Medications: Allergies as of 01/25/2017      Reactions   Codeine Nausea And Vomiting      Medication List    TAKE these medications   aspirin 81 MG EC tablet Take 1 tablet (81 mg total) by mouth daily.   atorvastatin 10 MG tablet Commonly known as:  LIPITOR Take 1 tablet by mouth every other day.   diclofenac 50 MG tablet Commonly known as:  CATAFLAM Take 50 mg by mouth daily.   estradiol 1 MG tablet Commonly known as:  ESTRACE Take 1 tablet by mouth daily.   fexofenadine 180 MG tablet Commonly known as:  ALLEGRA Take 1 tablet (180 mg total) by mouth daily.   omega-3 acid ethyl esters 1 g capsule Commonly known as:  LOVAZA Take 1 g by mouth 2 (two) times daily.   oxyCODONE 5 MG immediate release tablet Commonly known as:  Oxy IR/ROXICODONE Take 1-2 tablets (5-10 mg total) by mouth every 6 (six) hours as needed for severe pain.            Discharge Care Instructions        Start     Ordered   01/25/17 0000  aspirin EC 81 MG EC tablet  Daily     01/25/17 0732   01/25/17 0000  oxyCODONE (OXY IR/ROXICODONE) 5 MG immediate release tablet  Every 6 hours PRN    Question:  Supervising Provider  Answer:  Donata Clay, Theron Arista   01/25/17 0732      Follow Up Appointments: Follow-up Information    Kerin Perna, MD Follow up on 01/31/2017.     Specialty:  Cardiothoracic Surgery Why:  Appointment to see the surgeon on 02/07/2017 at 9:30 AM. Please obtain a chest x-ray at 9 AM at [facility] which is located in the same office complex. Contact information: [address] [place] [address] [phone]         Suture removal appt will be called to patient  Signed: GOLD,WAYNE EPA-C 01/25/2017, 7:43 AM

## 2017-01-18 NOTE — Anesthesia Preprocedure Evaluation (Deleted)
Anesthesia Evaluation    Reviewed: Allergy & Precautions, Patient's Chart, lab work & pertinent test results  Airway        Dental   Pulmonary asthma ,           Cardiovascular negative cardio ROS       Neuro/Psych  Headaches,    GI/Hepatic negative GI ROS, Neg liver ROS,   Endo/Other  negative endocrine ROS  Renal/GU negative Renal ROS     Musculoskeletal negative musculoskeletal ROS (+)   Abdominal   Peds  Hematology negative hematology ROS (+)   Anesthesia Other Findings Day of surgery medications reviewed with the patient.  Reproductive/Obstetrics                             Anesthesia Physical Anesthesia Plan  ASA: III  Anesthesia Plan: General   Post-op Pain Management:    Induction: Intravenous  PONV Risk Score and Plan: 4 or greater and Ondansetron, Dexamethasone, Midazolam, Scopolamine patch - Pre-op and Propofol infusion  Airway Management Planned: Double Lumen EBT  Additional Equipment: CVP  Intra-op Plan:   Post-operative Plan: Extubation in OR  Informed Consent: I have reviewed the patients History and Physical, chart, labs and discussed the procedure including the risks, benefits and alternatives for the proposed anesthesia with the patient or authorized representative who has indicated his/her understanding and acceptance.   Dental advisory given  Plan Discussed with: CRNA  Anesthesia Plan Comments:         Anesthesia Quick Evaluation

## 2017-01-18 NOTE — Plan of Care (Signed)
Problem: Pain Managment: Goal: General experience of comfort will improve Outcome: Progressing Patient has episodes of spasms/pain within right shoulder and neck. PRN medication given as ordered, with noted relief.   Problem: Physical Regulation: Goal: Ability to maintain clinical measurements within normal limits will improve Outcome: Progressing Chest tube output monitored. No advancement of crepitus. SpO2 >95%, no SOB noted.   Problem: Activity: Goal: Risk for activity intolerance will decrease Outcome: Progressing With assistance, patient able to ambulate to bathroom.

## 2017-01-18 NOTE — Progress Notes (Addendum)
      [address]       [place] [address]             [phone]      Procedure(s) (LRB):  Subjective:  Patient has no complaints.    Objective: Vital signs in last 24 hours: Temp:  [97.8 F (36.6 C)-98.4 F (36.9 C)] 98.2 F (36.8 C) (08/23 0734) Pulse Rate:  [55-58] 58 (08/23 0734) Cardiac Rhythm: Normal sinus rhythm (08/23 0700) Resp:  [14-20] 15 (08/23 0734) BP: (118-144)/(63-86) 122/68 (08/23 0734) SpO2:  [97 %-100 %] 100 % (08/23 0734) Weight:  [137 lb 9.6 oz (62.4 kg)] 137 lb 9.6 oz (62.4 kg) (08/23 0440)  Intake/Output from previous day: 08/22 0701 - 08/23 0700 In: 1790.5 [P.O.:1190; I.V.:600.5] Out: 950 [Urine:900; Chest Tube:50]  General appearance: alert, cooperative and no distress Heart: regular rate and rhythm Lungs: clear to auscultation bilaterally Abdomen: soft, non-tender; bowel sounds normal; no masses,  no organomegaly Extremities: extremities normal, atraumatic, no cyanosis or edema Wound: clean and dry  Lab Results:  Recent Labs  01/16/17 1834 01/17/17 0408  WBC 7.0 9.2  HGB 13.2 12.5  HCT 39.2 37.1  PLT 236 219   BMET:  Recent Labs  01/16/17 1834 01/17/17 0408  NA 138 135  K 3.9 4.3  CL 108 107  CO2 22 21*  GLUCOSE 150* 150*  BUN 8 12  CREATININE 0.67 0.63  CALCIUM 8.6* 8.7*    PT/INR: No results for input(s): LABPROT, INR in the last 72 hours. ABG No results found for: PHART, HCO3, TCO2, ACIDBASEDEF, O2SAT CBG (last 3)  No results for input(s): GLUCAP in the last 72 hours.  Assessment/Plan: S/P Procedure(s) (LRB): VIDEO ASSISTED THORACOSCOPY (Right) STAPLING OF BLEBS (Right)  1. Pneumothorax- chest tube on water seal at midnight, no air leak this morning, CXR shows stable appearance of pneumothorax- will leave on water seal today 2. CV- hemodynamically stable 3. Pain control- stable 4. Dispo- patient stable, leave chest tube in place today on water seal, repeat CXR in AM.. If remains free of  air leak and CXR is stable will d/c chest tube tomorrow   LOS: 2 days    Raford Pitcher, ERIN 01/18/2017  patient examined and medical record reviewed,CXR done today reviewed,agree with above note. If airleak returns or lung drops on water seal will proceed with VATS tomorrow Kathlee Nations Trigt III 01/18/2017

## 2017-01-18 NOTE — Progress Notes (Signed)
Pt with apparent vagal response d/t N/V. HR dropped to low 30's bradycardia with blocked PAC's vs. 3rd degree HB. Spontaneously returned to Sinus Huston Foley in 50's. Lowella Dandy, PA-C notified. Order for phenergan received. Will continue to monitor.

## 2017-01-18 NOTE — Progress Notes (Signed)
C/O N/V and diaphoretic. Pt says she feels as if her blood sugar is low. CBG = 86. Pt has D5%NS0.9% infusing at 30 ml/hr. Lowella Dandy, PA-C notified. Order received for Zofran prn.

## 2017-01-19 ENCOUNTER — Encounter (HOSPITAL_COMMUNITY)
Admission: EM | Disposition: A | Discharge status: Home / Self Care | Payer: Self-pay | Source: Home / Self Care | Attending: Cardiothoracic Surgery

## 2017-01-19 ENCOUNTER — Inpatient Hospital Stay (HOSPITAL_COMMUNITY): Payer: BLUE CROSS/BLUE SHIELD

## 2017-01-19 SURGERY — VIDEO ASSISTED THORACOSCOPY
Anesthesia: General | Site: Chest | Laterality: Right

## 2017-01-19 MED ORDER — FENTANYL CITRATE (PF) 100 MCG/2ML IJ SOLN
25.0000 ug | Freq: Once | INTRAMUSCULAR | Status: AC
  Administered 2017-01-19: 25 ug via INTRAVENOUS
  Filled 2017-01-19: qty 2

## 2017-01-19 NOTE — Plan of Care (Signed)
Problem: Activity: Goal: Risk for activity intolerance will decrease Outcome: Progressing Up ad lib. Ambulates in room independently with ease

## 2017-01-19 NOTE — Plan of Care (Signed)
Problem: Education: Goal: Knowledge of [facility] Education information/materials will improve Outcome: Progressing Verbalizes understanding of pain scale and need to call for pain medication before pain becomes intolerable   Problem: Physical Regulation: Goal: Ability to maintain clinical measurements within normal limits will improve Outcome: Progressing Vital signs stable and within normal limits

## 2017-01-19 NOTE — Progress Notes (Addendum)
[address]       [place] [address]             [phone]        Procedure(s) (LRB): VIDEO ASSISTED THORACOSCOPY (Right) STAPLING OF BLEBS (Right) Subjective: Feels fine  Objective: Vital signs in last 24 hours: Temp:  [98 F (36.7 C)-99.4 F (37.4 C)] 98 F (36.7 C) (08/24 0529) Pulse Rate:  [51-61] 51 (08/24 0529) Cardiac Rhythm: Sinus bradycardia (08/24 0812) Resp:  [16-18] 16 (08/24 0529) BP: (123-138)/(57-67) 127/67 (08/24 0529) SpO2:  [96 %-100 %] 96 % (08/24 0529) Weight:  [134 lb 11.2 oz (61.1 kg)] 134 lb 11.2 oz (61.1 kg) (08/24 0529)  Hemodynamic parameters for last 24 hours:    Intake/Output from previous day: 08/23 0701 - 08/24 0700 In: 1081 [P.O.:480; I.V.:601] Out: 20 [Chest Tube:20] Intake/Output this shift: No intake/output data recorded.  General appearance: alert, cooperative and no distress Heart: regular rate and rhythm Lungs: clear to auscultation bilaterally Abdomen: benign Extremities: no edema or calf tenderness Wound: dressing CDI  Lab Results:  Recent Labs  01/16/17 1834 01/17/17 0408  WBC 7.0 9.2  HGB 13.2 12.5  HCT 39.2 37.1  PLT 236 219   BMET:  Recent Labs  01/16/17 1834 01/17/17 0408  NA 138 135  K 3.9 4.3  CL 108 107  CO2 22 21*  GLUCOSE 150* 150*  BUN 8 12  CREATININE 0.67 0.63  CALCIUM 8.6* 8.7*    PT/INR: No results for input(s): LABPROT, INR in the last 72 hours. ABG No results found for: PHART, HCO3, TCO2, ACIDBASEDEF, O2SAT CBG (last 3)   Recent Labs  01/18/17 1054  GLUCAP 86    Meds Scheduled Meds: . aspirin EC  81 mg Oral Daily  . diclofenac  50 mg Oral Daily  . docusate sodium  100 mg Oral BID  . enoxaparin (LOVENOX) injection  40 mg Subcutaneous Q24H  . estradiol  1 mg Oral Daily  . guaiFENesin  600 mg Oral BID  . loratadine  10 mg Oral Daily  . sodium chloride flush  3 mL Intravenous Q12H   Continuous Infusions: . dextrose 5 % and 0.9% NaCl 30 mL/hr at  01/19/17 0347   PRN Meds:.albuterol, HYDROmorphone (DILAUDID) injection, ondansetron (ZOFRAN) IV, promethazine, sorbitol, traMADol  Xrays Dg Chest 2 View  Result Date: 01/19/2017 CLINICAL DATA:  46 year old female with left chest tube for spontaneous pneumothorax. EXAM: CHEST  2 VIEW COMPARISON:  01/18/2017 FINDINGS: Cardiomediastinal silhouette is stable in size and morphology. A right apical pneumothorax appears slightly increased from prior examination, now measuring up to 2 cm. A right basilar chest tube remains in stable position. Right chest wall subcutaneous emphysema grossly stable. The bilateral lungs are otherwise clear. No acute osseous abnormalities. IMPRESSION: Slight interval increase in right apical pneumothorax now measuring up to 2 cm. Electronically Signed   By: Sande Brothers M.D.   On: 01/19/2017 07:11   Dg Chest Port 1 View  Result Date: 01/18/2017 CLINICAL DATA:  Right-sided chest tube, follow-up examination EXAM: PORTABLE CHEST 1 VIEW COMPARISON:  01/17/2017 FINDINGS: Cardiac shadow is stable. Elevation the right hemidiaphragm is again noted. Right chest tube is seen with stable apical pneumothorax. No new focal abnormality is noted. IMPRESSION: No change from the prior exam.  Stable right pneumothorax is noted Electronically Signed   By: Alcide Clever M.D.   On: 01/18/2017 08:11    Assessment/Plan: S/P Procedure(s) (LRB): VIDEO ASSISTED THORACOSCOPY (Right) STAPLING  OF BLEBS (Right)   1 slightly increased right pntx on am CXR- tiny air leak which appears intermitt.. Cont H2O seal for now.   LOS: 3 days    GOLD,WAYNE E 01/19/2017   CXR satisfactory- tidaling of water column  Basilar Chest tube pulled Will get CXR in am followup in office next week with cxr patient examined and medical record reviewed,agree with above note. Kathlee Nations Trigt III 01/19/2017

## 2017-01-19 NOTE — Progress Notes (Signed)
Pt ambulated 336ft without difficulty, stand by assist only. No complaints of pain or shortness of breath.

## 2017-01-20 ENCOUNTER — Inpatient Hospital Stay (HOSPITAL_COMMUNITY): Payer: BLUE CROSS/BLUE SHIELD

## 2017-01-20 MED ORDER — LIDOCAINE HCL (PF) 1 % IJ SOLN
INTRAMUSCULAR | Status: AC
  Filled 2017-01-20: qty 30

## 2017-01-20 MED ORDER — KETOROLAC TROMETHAMINE 15 MG/ML IJ SOLN
15.0000 mg | Freq: Four times a day (QID) | INTRAMUSCULAR | Status: DC | PRN
  Administered 2017-01-20 – 2017-01-21 (×5): 15 mg via INTRAVENOUS
  Filled 2017-01-20 (×5): qty 1

## 2017-01-20 MED ORDER — MORPHINE SULFATE (PF) 4 MG/ML IV SOLN: 3.0000 mg | Freq: Once | INTRAVENOUS | Status: DC

## 2017-01-20 MED ORDER — MORPHINE SULFATE (PF) 4 MG/ML IV SOLN
INTRAVENOUS | Status: AC
  Administered 2017-01-20: 3 mg
  Filled 2017-01-20: qty 1

## 2017-01-20 MED ORDER — MORPHINE SULFATE (PF) 4 MG/ML IV SOLN
INTRAVENOUS | Status: AC
  Administered 2017-01-20: 14:00:00
  Filled 2017-01-20: qty 1

## 2017-01-20 NOTE — Plan of Care (Signed)
Problem: Pain Managment: Goal: General experience of comfort will improve Outcome: Progressing Pt states her pain is improving.  Pt still experienced pain with activity.

## 2017-01-20 NOTE — Progress Notes (Signed)
Patient called c/o feeling Shortness of breath. sats 100% on RA. PA in to see patient.Will continue to monitor

## 2017-01-20 NOTE — Progress Notes (Signed)
      [address]       [place] [address]             [phone]     Brief Procedure   Indication : right pneumothorax   Procedure: right pigtail catheter placement   Chlorahexidine prep   20 cc 1 percent local anest.   Usual technique   Patient tol well, PCX pending   Deaisha Welborn E, PA-C

## 2017-01-20 NOTE — Progress Notes (Addendum)
[address]       [place] [address]             [phone]        Procedure(s) (LRB): VIDEO ASSISTED THORACOSCOPY (Right) STAPLING OF BLEBS (Right) Subjective: Feels a bit more SOB, but overall reasonably comfortable at rest  Objective: Vital signs in last 24 hours: Temp:  [98.1 F (36.7 C)-98.6 F (37 C)] 98.5 F (36.9 C) (08/25 0821) Pulse Rate:  [54-74] 74 (08/25 0821) Cardiac Rhythm: Normal sinus rhythm (08/25 0830) Resp:  [13-16] 15 (08/25 0821) BP: (110-113)/(54-78) 110/78 (08/25 0821) SpO2:  [95 %-98 %] 98 % (08/25 0821) Weight:  [134 lb 1.6 oz (60.8 kg)] 134 lb 1.6 oz (60.8 kg) (08/25 0500)  Hemodynamic parameters for last 24 hours:    Intake/Output from previous day: No intake/output data recorded. Intake/Output this shift: Total I/O In: 240 [P.O.:240] Out: -   General appearance: alert, cooperative and no distress Heart: regular rate and rhythm Lungs: a little course on right , + SQ air Abdomen: benign exam  Lab Results: No results for input(s): WBC, HGB, HCT, PLT in the last 72 hours. BMET: No results for input(s): NA, K, CL, CO2, GLUCOSE, BUN, CREATININE, CALCIUM in the last 72 hours.  PT/INR: No results for input(s): LABPROT, INR in the last 72 hours. ABG No results found for: PHART, HCO3, TCO2, ACIDBASEDEF, O2SAT CBG (last 3)   Recent Labs  01/18/17 1054  GLUCAP 86    Meds Scheduled Meds: . aspirin EC  81 mg Oral Daily  . diclofenac  50 mg Oral Daily  . docusate sodium  100 mg Oral BID  . enoxaparin (LOVENOX) injection  40 mg Subcutaneous Q24H  . estradiol  1 mg Oral Daily  . guaiFENesin  600 mg Oral BID  . loratadine  10 mg Oral Daily  . sodium chloride flush  3 mL Intravenous Q12H   Continuous Infusions: PRN Meds:.albuterol, ondansetron (ZOFRAN) IV, promethazine, sorbitol, traMADol  Xrays Dg Chest 2 View  Result Date: 01/19/2017 CLINICAL DATA:  46 year old female with left chest tube for spontaneous  pneumothorax. EXAM: CHEST  2 VIEW COMPARISON:  01/18/2017 FINDINGS: Cardiomediastinal silhouette is stable in size and morphology. A right apical pneumothorax appears slightly increased from prior examination, now measuring up to 2 cm. A right basilar chest tube remains in stable position. Right chest wall subcutaneous emphysema grossly stable. The bilateral lungs are otherwise clear. No acute osseous abnormalities. IMPRESSION: Slight interval increase in right apical pneumothorax now measuring up to 2 cm. Electronically Signed   By: Sande Brothers M.D.   On: 01/19/2017 07:11    Assessment/Plan: S/P Procedure(s) (LRB): VIDEO ASSISTED THORACOSCOPY (Right) STAPLING OF BLEBS (Right)  1 CXR shows increase in pntx/SQ air, sats are 100 on RA- tested in room , no distress. Will prob require VATS stapling of Blebs /pleurodesis . Does not appear to need urgent CT placement currently.   LOS: 4 days    GOLD,WAYNE E 01/20/2017  I have seen and examined the patient and agree with the assessment and plan as outlined.  However right PTX has definitely enlarged and patient does have some SOB.  I think she needs a tube replaced and she may ultimately need VATS.  Discussed the situation at length with the patient and her husband at the bedside.  Alternative treatment strategies and the indications for surgical intervention discussed.  Will get non-contract CT scan of chest tomorrow to look for signs  of blebs.   I spent in excess of 15 minutes during the conduct of this hospital encounter and >50% of this time involved direct face-to-face encounter with the patient for counseling and/or coordination of their care.   Purcell Nails, MD 01/20/2017 12:08 PM

## 2017-01-21 ENCOUNTER — Inpatient Hospital Stay (HOSPITAL_COMMUNITY): Payer: BLUE CROSS/BLUE SHIELD

## 2017-01-21 ENCOUNTER — Encounter (HOSPITAL_COMMUNITY): Payer: Self-pay | Admitting: *Deleted

## 2017-01-21 LAB — BLOOD GAS, ARTERIAL
Acid-base deficit: 1.8 mmol/L (ref 0.0–2.0)
Bicarbonate: 22.2 mmol/L (ref 20.0–28.0)
Drawn by: 105521
FIO2: 0.21
O2 Saturation: 96.4 %
Patient temperature: 98.6
pCO2 arterial: 36.4 mmHg (ref 32.0–48.0)
pH, Arterial: 7.403 (ref 7.350–7.450)
pO2, Arterial: 86.2 mmHg (ref 83.0–108.0)

## 2017-01-21 LAB — URINALYSIS, ROUTINE W REFLEX MICROSCOPIC
Bilirubin Urine: NEGATIVE
Glucose, UA: NEGATIVE mg/dL
Hgb urine dipstick: NEGATIVE
Ketones, ur: NEGATIVE mg/dL
Leukocytes, UA: NEGATIVE
Nitrite: NEGATIVE
Protein, ur: NEGATIVE mg/dL
Specific Gravity, Urine: 1.017 (ref 1.005–1.030)
pH: 5 (ref 5.0–8.0)

## 2017-01-21 LAB — CBC
HEMATOCRIT: 37.9 % (ref 36.0–46.0)
Hemoglobin: 12.5 g/dL (ref 12.0–15.0)
MCH: 28.7 pg (ref 26.0–34.0)
MCHC: 33 g/dL (ref 30.0–36.0)
MCV: 86.9 fL (ref 78.0–100.0)
PLATELETS: 234 10*3/uL (ref 150–400)
RBC: 4.36 MIL/uL (ref 3.87–5.11)
RDW: 13.4 % (ref 11.5–15.5)
WBC: 4.4 10*3/uL (ref 4.0–10.5)

## 2017-01-21 LAB — COMPREHENSIVE METABOLIC PANEL
ALT: 72 U/L — ABNORMAL HIGH (ref 14–54)
AST: 59 U/L — ABNORMAL HIGH (ref 15–41)
Albumin: 3.3 g/dL — ABNORMAL LOW (ref 3.5–5.0)
Alkaline Phosphatase: 62 U/L (ref 38–126)
Anion gap: 7 (ref 5–15)
BUN: 18 mg/dL (ref 6–20)
CO2: 26 mmol/L (ref 22–32)
Calcium: 8.8 mg/dL — ABNORMAL LOW (ref 8.9–10.3)
Chloride: 103 mmol/L (ref 101–111)
Creatinine, Ser: 0.85 mg/dL (ref 0.44–1.00)
GFR calc Af Amer: >60 mL/min (ref >60)
GFR calc non Af Amer: >60 mL/min (ref >60)
Glucose, Bld: 108 mg/dL — ABNORMAL HIGH (ref 65–99)
Potassium: 4.1 mmol/L (ref 3.5–5.1)
Sodium: 136 mmol/L (ref 135–145)
Total Bilirubin: 0.6 mg/dL (ref 0.3–1.2)
Total Protein: 6.1 g/dL — ABNORMAL LOW (ref 6.5–8.1)

## 2017-01-21 LAB — PROTIME-INR
INR: 1
Prothrombin Time: 13.2 seconds (ref 11.4–15.2)

## 2017-01-21 LAB — SURGICAL PCR SCREEN
MRSA, PCR: NEGATIVE
Staphylococcus aureus: NEGATIVE

## 2017-01-21 LAB — APTT: aPTT: 30 seconds (ref 24–36)

## 2017-01-21 LAB — TYPE AND SCREEN
ABO/RH(D): A POS
Antibody Screen: NEGATIVE

## 2017-01-21 LAB — ABO/RH: ABO/RH(D): A POS

## 2017-01-21 MED ORDER — DEXTROSE 5 % IV SOLN: 1.5000 g | INTRAVENOUS | Status: DC

## 2017-01-21 MED ORDER — SCOPOLAMINE 1 MG/3DAYS TD PT72SCOPOLAMINE 1 MG/3DAYS
1.0000 | MEDICATED_PATCH | TRANSDERMAL | Status: DC
Start: 2017-01-21 — End: 2017-01-22
  Administered 2017-01-22: 1 via TRANSDERMAL
  Filled 2017-01-21: qty 1

## 2017-01-21 MED ORDER — DEXTROSE 5 % IV SOLN
1.5000 g | INTRAVENOUS | Status: AC
  Administered 2017-01-22: 1.5 g via INTRAVENOUS
  Filled 2017-01-21: qty 1.5

## 2017-01-21 NOTE — Progress Notes (Signed)
[address]       [place] [address]             [phone]      CARDIOTHORACIC SURGERY PROGRESS NOTE  Subjective: Feels better.  Developed SOB when tube briefly taken off of suction.  Objective: Vital signs in last 24 hours: Temp:  [97.6 F (36.4 C)-98.4 F (36.9 C)] 97.8 F (36.6 C) (08/26 0435) Pulse Rate:  [60-67] 60 (08/26 0435) Cardiac Rhythm: Normal sinus rhythm (08/26 0906) Resp:  [16-18] 16 (08/26 0435) BP: (114-136)/(73-84) 120/73 (08/26 0435) SpO2:  [95 %-99 %] 95 % (08/26 0435) Weight:  [134 lb 12.8 oz (61.1 kg)] 134 lb 12.8 oz (61.1 kg) (08/26 0435)  Physical Exam:  Rhythm:   sinus  Breath sounds: clear  Heart sounds:  RRR  Incisions:  n/a  Abdomen:  soft  Extremities:  warm  Chest tube(s):  + air leak w/ cough, deep breath   Intake/Output from previous day: 08/25 0701 - 08/26 0700 In: 483 [P.O.:480; I.V.:3] Out: 250 [Urine:250] Intake/Output this shift: Total I/O In: 240 [P.O.:240] Out: -   Lab Results: No results for input(s): WBC, HGB, HCT, PLT in the last 72 hours. BMET: No results for input(s): NA, K, CL, CO2, GLUCOSE, BUN, CREATININE, CALCIUM in the last 72 hours.  CBG (last 3)  No results for input(s): GLUCAP in the last 72 hours.  CXR:  CHEST  2 VIEW  COMPARISON:  CT chest dated 01/21/2017  FINDINGS: Small right apical pneumothorax, grossly unchanged. Indwelling right pigtail drain. Associated subcutaneous emphysema along the right lateral chest wall extending into the right supraclavicular region.  Left lung is clear.  No pleural effusion.  The heart is normal in size.  IMPRESSION: Stable small right pneumothorax with indwelling right pigtail drain. Associated subcutaneous emphysema.   Electronically Signed   By: Charline Bills M.D.   On: 01/21/2017 10:13  CT CHEST WITHOUT CONTRAST  TECHNIQUE: Multidetector CT imaging of the chest was performed following the standard protocol without  IV contrast.  COMPARISON:  Chest x-ray from earlier today.  FINDINGS: Cardiovascular: The heart size is normal. No pericardial effusion. No thoracic aortic aneurysm.  Mediastinum/Nodes: No mediastinal lymphadenopathy. No evidence for gross hilar lymphadenopathy although assessment is limited by the lack of intravenous contrast on today's study. The esophagus has normal imaging features. There is no axillary lymphadenopathy.  Lungs/Pleura: Anterior right pigtail pleural catheter identified with small residual right pneumothorax. Right lower lobe atelectasis evident. Left lung clear.  Upper Abdomen: Visualized portion of the lower right neck.  Musculoskeletal: Bone windows reveal no worrisome lytic or sclerotic osseous lesions. No evidence for right-sided rib fracture. Fairly extensive subcutaneous emphysema noted in the right chest wall  IMPRESSION: 1. Small right pneumothorax with right pleural drain in situ. 2. Basilar atelectasis. 3. Subcutaneous emphysema right chest wall and lower neck.   Electronically Signed   By: Kennith Center M.D.   On: 01/21/2017 10:12    Assessment/Plan: S/P Procedure(s) (LRB): VIDEO ASSISTED THORACOSCOPY (Right) STAPLING OF BLEBS (Right)  Patient has persistent small air leak after chest tube replaced for right spontaneous pneumothorax.  I discussed the indications, risks and potential benefits of right VATS for stapling of blebs and mechanical pleurodesis with the patient and her husband.  Expectations for her postoperative recovery were discussed.  For OR tomorrow by Dr. Donata Clay.  All questions answered.  I spent in excess of 15 minutes during the conduct of this hospital  encounter and >50% of this time involved direct face-to-face encounter with the patient for counseling and/or coordination of their care.   Purcell Nails, MD 01/21/2017 12:22 PM

## 2017-01-21 NOTE — Anesthesia Preprocedure Evaluation (Addendum)
Anesthesia Evaluation    Reviewed: Allergy & Precautions, NPO status , Patient's Chart, lab work & pertinent test results  Airway Mallampati: I  TM Distance: >3 FB Neck ROM: Full    Dental no notable dental hx.    Pulmonary  spontaneous pneumothorax   Pulmonary exam normal breath sounds clear to auscultation       Cardiovascular negative cardio ROS Normal cardiovascular exam Rhythm:Regular Rate:Normal  ECG: SB, rate 58   Neuro/Psych  Headaches, negative psych ROS   GI/Hepatic negative GI ROS, Neg liver ROS,   Endo/Other  negative endocrine ROS  Renal/GU negative Renal ROS     Musculoskeletal negative musculoskeletal ROS (+)   Abdominal   Peds  Hematology negative hematology ROS (+)   Anesthesia Other Findings Day of surgery medications reviewed with the patient.  High cholesterol  Reproductive/Obstetrics S/p hysterectomy                           Anesthesia Physical Anesthesia Plan  ASA: III  Anesthesia Plan: General   Post-op Pain Management:    Induction: Intravenous  PONV Risk Score and Plan: 3 and Ondansetron, Dexamethasone, Scopolamine patch - Pre-op and Midazolam  Airway Management Planned: Double Lumen EBT  Additional Equipment: Arterial line  Intra-op Plan:   Post-operative Plan: Possible Post-op intubation/ventilation  Informed Consent: I have reviewed the patients History and Physical, chart, labs and discussed the procedure including the risks, benefits and alternatives for the proposed anesthesia with the patient or authorized representative who has indicated his/her understanding and acceptance.   Dental advisory given  Plan Discussed with: CRNA  Anesthesia Plan Comments:        Anesthesia Quick Evaluation

## 2017-01-22 ENCOUNTER — Inpatient Hospital Stay (HOSPITAL_COMMUNITY): Payer: BLUE CROSS/BLUE SHIELD | Admitting: Anesthesiology

## 2017-01-22 ENCOUNTER — Encounter (HOSPITAL_COMMUNITY)
Admission: EM | Disposition: A | Discharge status: Home / Self Care | Payer: Self-pay | Source: Home / Self Care | Attending: Cardiothoracic Surgery

## 2017-01-22 ENCOUNTER — Inpatient Hospital Stay (HOSPITAL_COMMUNITY): Payer: BLUE CROSS/BLUE SHIELD

## 2017-01-22 DIAGNOSIS — J9383 Other pneumothorax: Secondary | ICD-10-CM | POA: Diagnosis present

## 2017-01-22 HISTORY — PX: TALC PLEURODESIS: SHX2506

## 2017-01-22 HISTORY — PX: VIDEO ASSISTED THORACOSCOPY: SHX5073

## 2017-01-22 SURGERY — VIDEO ASSISTED THORACOSCOPY
Anesthesia: General | Site: Chest | Laterality: Right

## 2017-01-22 MED ORDER — PROPOFOL 10 MG/ML IV BOLUS
INTRAVENOUS | Status: DC | PRN
  Administered 2017-01-22: 140 mg via INTRAVENOUS
  Administered 2017-01-22: 60 mg via INTRAVENOUS

## 2017-01-22 MED ORDER — FENTANYL CITRATE (PF) 250 MCG/5ML IJ SOLN
INTRAMUSCULAR | Status: AC
  Filled 2017-01-22: qty 5

## 2017-01-22 MED ORDER — PROPOFOL 10 MG/ML IV BOLUS
INTRAVENOUS | Status: AC
  Filled 2017-01-22: qty 20

## 2017-01-22 MED ORDER — HEMOSTATIC AGENTS (NO CHARGE) OPTIME
TOPICAL | Status: DC | PRN
  Administered 2017-01-22: 1 via TOPICAL

## 2017-01-22 MED ORDER — LABETALOL HCL 5 MG/ML IV SOLN
INTRAVENOUS | Status: DC | PRN
  Administered 2017-01-22: 5 mg via INTRAVENOUS
  Administered 2017-01-22: 10 mg via INTRAVENOUS

## 2017-01-22 MED ORDER — SUGAMMADEX SODIUM 200 MG/2ML IV SOLN
INTRAVENOUS | Status: AC
  Filled 2017-01-22: qty 2

## 2017-01-22 MED ORDER — DEXTROSE-NACL 5-0.9 % IV SOLN
INTRAVENOUS | Status: DC
  Administered 2017-01-22: 12:00:00 via INTRAVENOUS

## 2017-01-22 MED ORDER — OXYCODONE HCL 5 MG PO TABS
5.0000 mg | ORAL_TABLET | ORAL | Status: DC | PRN
  Filled 2017-01-22: qty 2

## 2017-01-22 MED ORDER — KETOROLAC TROMETHAMINE 15 MG/ML IJ SOLN
15.0000 mg | Freq: Once | INTRAMUSCULAR | Status: AC
  Administered 2017-01-22: 15 mg via INTRAVENOUS
  Filled 2017-01-22: qty 1

## 2017-01-22 MED ORDER — DEXAMETHASONE SODIUM PHOSPHATE 10 MG/ML IJ SOLN
INTRAMUSCULAR | Status: DC | PRN
  Administered 2017-01-22: 5 mg via INTRAVENOUS

## 2017-01-22 MED ORDER — BISACODYL 5 MG PO TBEC
10.0000 mg | DELAYED_RELEASE_TABLET | Freq: Every day | ORAL | Status: DC
  Administered 2017-01-22 – 2017-01-23 (×2): 10 mg via ORAL
  Filled 2017-01-22 (×4): qty 2

## 2017-01-22 MED ORDER — LABETALOL HCL 5 MG/ML IV SOLN
INTRAVENOUS | Status: AC
  Filled 2017-01-22: qty 4

## 2017-01-22 MED ORDER — POTASSIUM CHLORIDE 10 MEQ/50ML IV SOLN
10.0000 meq | Freq: Every day | INTRAVENOUS | Status: DC | PRN
  Filled 2017-01-22: qty 50

## 2017-01-22 MED ORDER — ONDANSETRON HCL 4 MG/2ML IJ SOLN: 4.0000 mg | Freq: Four times a day (QID) | INTRAMUSCULAR | Status: DC | PRN

## 2017-01-22 MED ORDER — ONDANSETRON HCL 4 MG/2ML IJ SOLN
INTRAMUSCULAR | Status: AC
  Filled 2017-01-22: qty 2

## 2017-01-22 MED ORDER — LACTATED RINGERS IV SOLN
INTRAVENOUS | Status: DC | PRN
  Administered 2017-01-22: 07:00:00 via INTRAVENOUS

## 2017-01-22 MED ORDER — TALC (STERITALC) POWDER FOR INTRAPLEURAL USE
4.0000 g | INTRAPLEURAL | Status: DC
  Filled 2017-01-22: qty 4

## 2017-01-22 MED ORDER — FENTANYL CITRATE (PF) 100 MCG/2ML IJ SOLN
INTRAMUSCULAR | Status: DC | PRN
  Administered 2017-01-22: 100 ug via INTRAVENOUS
  Administered 2017-01-22 (×2): 50 ug via INTRAVENOUS
  Administered 2017-01-22 (×2): 100 ug via INTRAVENOUS
  Administered 2017-01-22 (×2): 50 ug via INTRAVENOUS

## 2017-01-22 MED ORDER — OXYCODONE HCL 5 MG/5ML PO SOLN: 5.0000 mg | Freq: Once | ORAL | Status: DC | PRN

## 2017-01-22 MED ORDER — HYDROMORPHONE HCL 1 MG/ML IJ SOLN
INTRAMUSCULAR | Status: AC
  Filled 2017-01-22: qty 1

## 2017-01-22 MED ORDER — FENTANYL CITRATE (PF) 100 MCG/2ML IJ SOLN: 25.0000 ug | Freq: Four times a day (QID) | INTRAMUSCULAR | Status: DC | PRN

## 2017-01-22 MED ORDER — TALC 5 G PL SUSR
INTRAPLEURAL | Status: DC | PRN
  Administered 2017-01-22: 4 g via INTRAPLEURAL

## 2017-01-22 MED ORDER — TALC (STERITALC) POWDER FOR INTRAPLEURAL USE: 4.0000 g | INTRAPLEURAL | Status: DC

## 2017-01-22 MED ORDER — MIDAZOLAM HCL 2 MG/2ML IJ SOLN
INTRAMUSCULAR | Status: AC
  Filled 2017-01-22: qty 2

## 2017-01-22 MED ORDER — SENNOSIDES-DOCUSATE SODIUM 8.6-50 MG PO TABS
1.0000 | ORAL_TABLET | Freq: Every day | ORAL | Status: DC
  Administered 2017-01-22 – 2017-01-23 (×2): 1 via ORAL
  Filled 2017-01-22 (×3): qty 1

## 2017-01-22 MED ORDER — FENTANYL 40 MCG/ML IV SOLN
INTRAVENOUS | Status: DC
  Administered 2017-01-22: 1000 ug via INTRAVENOUS
  Administered 2017-01-22: 8.63 mL via INTRAVENOUS
  Administered 2017-01-22: 525 ug via INTRAVENOUS
  Administered 2017-01-23: 120 ug via INTRAVENOUS
  Administered 2017-01-23: 1000 ug via INTRAVENOUS
  Administered 2017-01-23: 75 ug via INTRAVENOUS
  Administered 2017-01-23: 15 ug via INTRAVENOUS
  Administered 2017-01-23 – 2017-01-24 (×3): 0 ug via INTRAVENOUS
  Filled 2017-01-22 (×3): qty 25

## 2017-01-22 MED ORDER — LIDOCAINE HCL (CARDIAC) 20 MG/ML IV SOLN
INTRAVENOUS | Status: DC | PRN
  Administered 2017-01-22: 70 mg via INTRAVENOUS

## 2017-01-22 MED ORDER — SODIUM CHLORIDE 0.9 % IV SOLN
4.0000 g | Freq: Once | INTRAVENOUS | Status: DC
  Filled 2017-01-22 (×2): qty 4

## 2017-01-22 MED ORDER — KETOROLAC TROMETHAMINE 15 MG/ML IJ SOLN
15.0000 mg | Freq: Four times a day (QID) | INTRAMUSCULAR | Status: AC
  Administered 2017-01-22 – 2017-01-23 (×5): 15 mg via INTRAVENOUS
  Filled 2017-01-22 (×5): qty 1

## 2017-01-22 MED ORDER — PHENYLEPHRINE HCL 10 MG/ML IJ SOLN
INTRAVENOUS | Status: DC | PRN
  Administered 2017-01-22: 15 ug/min via INTRAVENOUS

## 2017-01-22 MED ORDER — DIPHENHYDRAMINE HCL 50 MG/ML IJ SOLN: 12.5000 mg | Freq: Four times a day (QID) | INTRAMUSCULAR | Status: DC | PRN

## 2017-01-22 MED ORDER — ACETAMINOPHEN 500 MG PO TABS
1000.0000 mg | ORAL_TABLET | Freq: Four times a day (QID) | ORAL | Status: DC
  Administered 2017-01-22 – 2017-01-25 (×10): 1000 mg via ORAL
  Filled 2017-01-22 (×10): qty 2

## 2017-01-22 MED ORDER — ACETAMINOPHEN 160 MG/5ML PO SOLN: 1000.0000 mg | Freq: Four times a day (QID) | ORAL | Status: DC

## 2017-01-22 MED ORDER — NALOXONE HCL 0.4 MG/ML IJ SOLN: 0.4000 mg | INTRAMUSCULAR | Status: DC | PRN

## 2017-01-22 MED ORDER — MIDAZOLAM HCL 5 MG/5ML IJ SOLN
INTRAMUSCULAR | Status: DC | PRN
  Administered 2017-01-22: 2 mg via INTRAVENOUS

## 2017-01-22 MED ORDER — PANTOPRAZOLE SODIUM 40 MG PO TBEC
40.0000 mg | DELAYED_RELEASE_TABLET | Freq: Every day | ORAL | Status: DC
  Administered 2017-01-22 – 2017-01-25 (×4): 40 mg via ORAL
  Filled 2017-01-22 (×4): qty 1

## 2017-01-22 MED ORDER — PROMETHAZINE HCL 25 MG/ML IJ SOLN: 6.2500 mg | INTRAMUSCULAR | Status: DC | PRN

## 2017-01-22 MED ORDER — ONDANSETRON HCL 4 MG/2ML IJ SOLN
INTRAMUSCULAR | Status: DC | PRN
  Administered 2017-01-22: 4 mg via INTRAVENOUS

## 2017-01-22 MED ORDER — ROCURONIUM BROMIDE 100 MG/10ML IV SOLN
INTRAVENOUS | Status: DC | PRN
  Administered 2017-01-22: 50 mg via INTRAVENOUS
  Administered 2017-01-22: 20 mg via INTRAVENOUS

## 2017-01-22 MED ORDER — DEXAMETHASONE SODIUM PHOSPHATE 10 MG/ML IJ SOLN
INTRAMUSCULAR | Status: AC
  Filled 2017-01-22: qty 1

## 2017-01-22 MED ORDER — FENTANYL CITRATE (PF) 100 MCG/2ML IJ SOLN
25.0000 ug | Freq: Once | INTRAMUSCULAR | Status: AC
  Administered 2017-01-22: 25 ug via INTRAVENOUS
  Filled 2017-01-22: qty 2

## 2017-01-22 MED ORDER — SCOPOLAMINE 1 MG/3DAYS TD PT72
MEDICATED_PATCH | TRANSDERMAL | Status: AC
  Filled 2017-01-22: qty 1

## 2017-01-22 MED ORDER — HYDROMORPHONE HCL 1 MG/ML IJ SOLN
0.2500 mg | INTRAMUSCULAR | Status: DC | PRN
  Administered 2017-01-22 (×2): 0.5 mg via INTRAVENOUS

## 2017-01-22 MED ORDER — HYDROMORPHONE HCL 1 MG/ML IJ SOLN: 0.2500 mg | INTRAMUSCULAR | Status: DC | PRN

## 2017-01-22 MED ORDER — 0.9 % SODIUM CHLORIDE (POUR BTL) OPTIME
TOPICAL | Status: DC | PRN
  Administered 2017-01-22: 1000 mL

## 2017-01-22 MED ORDER — OXYCODONE HCL 5 MG PO TABS: 5.0000 mg | ORAL_TABLET | Freq: Once | ORAL | Status: DC | PRN

## 2017-01-22 MED ORDER — SODIUM CHLORIDE 0.9% FLUSH: 9.0000 mL | INTRAVENOUS | Status: DC | PRN

## 2017-01-22 MED ORDER — DIPHENHYDRAMINE HCL 12.5 MG/5ML PO ELIX
12.5000 mg | ORAL_SOLUTION | Freq: Four times a day (QID) | ORAL | Status: DC | PRN
  Filled 2017-01-22: qty 5

## 2017-01-22 MED ORDER — SUGAMMADEX SODIUM 200 MG/2ML IV SOLN
INTRAVENOUS | Status: DC | PRN
  Administered 2017-01-22: 120 mg via INTRAVENOUS

## 2017-01-22 MED ORDER — SCOPOLAMINE 1 MG/3DAYS TD PT72: 1.0000 | MEDICATED_PATCH | TRANSDERMAL | Status: DC

## 2017-01-22 SURGICAL SUPPLY — 69 items
APPLICATOR TIP EXT COSEAL (VASCULAR PRODUCTS) ×8 IMPLANT
BAG DECANTER FOR FLEXI CONT (MISCELLANEOUS) IMPLANT
BLADE SURG 11 STRL SS (BLADE) ×4 IMPLANT
CANISTER SUCT 3000ML PPV (MISCELLANEOUS) ×4 IMPLANT
CATH KIT ON Q 5IN SLV (PAIN MANAGEMENT) IMPLANT
CATH ROBINSON RED A/P 22FR (CATHETERS) IMPLANT
CATH THORACIC 28FR (CATHETERS) ×4 IMPLANT
CATH THORACIC 36FR (CATHETERS) IMPLANT
CATH THORACIC 36FR RT ANG (CATHETERS) IMPLANT
CONT SPEC 4OZ CLIKSEAL STRL BL (MISCELLANEOUS) ×8 IMPLANT
COVER SURGICAL LIGHT HANDLE (MISCELLANEOUS) ×8 IMPLANT
CUTTER ECHEON FLEX ENDO 45 340 (ENDOMECHANICALS) ×4 IMPLANT
DERMABOND ADVANCED (GAUZE/BANDAGES/DRESSINGS) ×2
DERMABOND ADVANCED .7 DNX12 (GAUZE/BANDAGES/DRESSINGS) ×2 IMPLANT
DRAPE LAPAROSCOPIC ABDOMINAL (DRAPES) ×4 IMPLANT
DRAPE WARM FLUID 44X44 (DRAPE) ×4 IMPLANT
ELECT BLADE 4.0 EZ CLEAN MEGAD (MISCELLANEOUS) ×4
ELECT BLADE 6.5 EXT (BLADE) ×4 IMPLANT
ELECT REM PT RETURN 9FT ADLT (ELECTROSURGICAL) ×8
ELECTRODE BLDE 4.0 EZ CLN MEGD (MISCELLANEOUS) ×2 IMPLANT
ELECTRODE REM PT RTRN 9FT ADLT (ELECTROSURGICAL) ×4 IMPLANT
GAUZE SPONGE 4X4 12PLY STRL (GAUZE/BANDAGES/DRESSINGS) ×4 IMPLANT
GAUZE SPONGE 4X4 12PLY STRL LF (GAUZE/BANDAGES/DRESSINGS) ×4 IMPLANT
GLOVE BIO SURGEON STRL SZ 6.5 (GLOVE) ×6 IMPLANT
GLOVE BIO SURGEON STRL SZ7.5 (GLOVE) ×12 IMPLANT
GLOVE BIO SURGEONS STRL SZ 6.5 (GLOVE) ×2
GLOVE BIOGEL PI IND STRL 6.5 (GLOVE) ×4 IMPLANT
GLOVE BIOGEL PI INDICATOR 6.5 (GLOVE) ×4
GOWN STRL REUS W/ TWL LRG LVL3 (GOWN DISPOSABLE) ×6 IMPLANT
GOWN STRL REUS W/TWL LRG LVL3 (GOWN DISPOSABLE) ×6
KIT BASIN OR (CUSTOM PROCEDURE TRAY) ×4 IMPLANT
KIT ROOM TURNOVER OR (KITS) ×4 IMPLANT
KIT SUCTION CATH 14FR (SUCTIONS) ×8 IMPLANT
NS IRRIG 1000ML POUR BTL (IV SOLUTION) ×8 IMPLANT
PACK CHEST (CUSTOM PROCEDURE TRAY) ×4 IMPLANT
PAD ARMBOARD 7.5X6 YLW CONV (MISCELLANEOUS) ×8 IMPLANT
SEALANT SURG COSEAL 4ML (VASCULAR PRODUCTS) IMPLANT
SOLUTION ANTI FOG 6CC (MISCELLANEOUS) ×4 IMPLANT
SPONGE TONSIL 1 RF SGL (DISPOSABLE) ×4 IMPLANT
STAPLE RELOAD 45MM GOLD (STAPLE) ×24 IMPLANT
SUT CHROMIC 3 0 SH 27 (SUTURE) IMPLANT
SUT ETHILON 3 0 PS 1 (SUTURE) IMPLANT
SUT PROLENE 3 0 SH DA (SUTURE) IMPLANT
SUT PROLENE 4 0 RB 1 (SUTURE)
SUT PROLENE 4-0 RB1 .5 CRCL 36 (SUTURE) IMPLANT
SUT SILK  1 MH (SUTURE) ×4
SUT SILK 1 MH (SUTURE) ×4 IMPLANT
SUT SILK 2 0SH CR/8 30 (SUTURE) IMPLANT
SUT SILK 3 0SH CR/8 30 (SUTURE) IMPLANT
SUT VIC AB 1 CTX 18 (SUTURE) IMPLANT
SUT VIC AB 2 TP1 27 (SUTURE) IMPLANT
SUT VIC AB 2-0 CT2 18 VCP726D (SUTURE) IMPLANT
SUT VIC AB 2-0 CTX 36 (SUTURE) IMPLANT
SUT VIC AB 2-0 UR6 27 (SUTURE) ×16 IMPLANT
SUT VIC AB 3-0 SH 18 (SUTURE) IMPLANT
SUT VIC AB 3-0 SH 8-18 (SUTURE) ×4 IMPLANT
SUT VIC AB 3-0 X1 27 (SUTURE) ×12 IMPLANT
SUT VICRYL 0 UR6 27IN ABS (SUTURE) ×8 IMPLANT
SUT VICRYL 2 TP 1 (SUTURE) IMPLANT
SWAB COLLECTION DEVICE MRSA (MISCELLANEOUS) IMPLANT
SWAB CULTURE ESWAB REG 1ML (MISCELLANEOUS) IMPLANT
SYSTEM SAHARA CHEST DRAIN ATS (WOUND CARE) ×4 IMPLANT
TAPE CLOTH SURG 4X10 WHT LF (GAUZE/BANDAGES/DRESSINGS) ×4 IMPLANT
TIP APPLICATOR SPRAY EXTEND 16 (VASCULAR PRODUCTS) IMPLANT
TOWEL OR 17X24 6PK STRL BLUE (TOWEL DISPOSABLE) ×4 IMPLANT
TOWEL OR 17X26 10 PK STRL BLUE (TOWEL DISPOSABLE) ×8 IMPLANT
TRAP SPECIMEN MUCOUS 40CC (MISCELLANEOUS) IMPLANT
TRAY FOLEY W/METER SILVER 16FR (SET/KITS/TRAYS/PACK) ×4 IMPLANT
WATER STERILE IRR 1000ML POUR (IV SOLUTION) ×8 IMPLANT

## 2017-01-22 NOTE — Plan of Care (Signed)
Problem: Respiratory: Goal: Pain level will decrease with appropriate interventions Outcome: Progressing Monitoring pulse ox as well as continued use of incentive spirometer. Have instructed patient to use pillow when coughing. Will continue to monitor and educate as appropriate.

## 2017-01-22 NOTE — Anesthesia Procedure Notes (Signed)
Arterial Line Insertion Start/End7/15/2018 8:10 AM Performed by: WHITE, Blair Promise Suleyma Wafer, CRNA  Patient location: OR. Lidocaine 1% used for infiltration Left, radial was placed Catheter size: 20 G Hand hygiene performed  and maximum sterile barriers used  Allen's test indicative of satisfactory collateral circulation Attempts: 1 Procedure performed without using ultrasound guided technique. Following insertion, Biopatch. Post procedure assessment: normal  Patient tolerated the procedure well with no immediate complications.

## 2017-01-22 NOTE — Progress Notes (Signed)
Pre Procedure note for inpatients:   Christine Guerrero has been scheduled for Procedure(s): VIDEO ASSISTED THORACOSCOPY Stapling of blebs (Right) today. The various methods of treatment have been discussed with the patient. After consideration of the risks, benefits and treatment options the patient has consented to the planned procedure.   The patient has been seen and labs reviewed. There are no changes in the patient's condition to prevent proceeding with the planned procedure today.  Recent labs:  Lab Results  Component Value Date   WBC 4.4 01/21/2017   HGB 12.5 01/21/2017   HCT 37.9 01/21/2017   PLT 234 01/21/2017   GLUCOSE 108 (H) 01/21/2017   ALT 72 (H) 01/21/2017   AST 59 (H) 01/21/2017   NA 136 01/21/2017   K 4.1 01/21/2017   CL 103 01/21/2017   CREATININE 0.85 01/21/2017   BUN 18 01/21/2017   CO2 26 01/21/2017   INR 1.00 01/21/2017    Mikey Bussing, MD 01/22/2017 7:38 AM

## 2017-01-22 NOTE — Op Note (Signed)
NAME:  Christine Guerrero, Christine Guerrero NO.:  [id number]  MEDICAL RECORD NO.:  [id number]  LOCATION:  [facility]                        FACILITY:  [facility]  PHYSICIAN:  Kerin Perna, M.D.  DATE OF BIRTH:  04-24-1971  DATE OF PROCEDURE:  01/22/2017 DATE OF DISCHARGE:                              OPERATIVE REPORT   OPERATION: 1. Right VATS (video-assisted thoracoscopic surgery) with stapling of     right upper lobe apical blebs. 2. Talc pleurodesis of the right pleural space.  SURGEON:  Kerin Perna, M.D.  ASSISTANT:  Rowe Clack, PA-C.  ANESTHESIA:  General.  PREOPERATIVE DIAGNOSIS:  Spontaneous right pneumothorax with prolonged air leak.  POSTOPERATIVE DIAGNOSIS:  Spontaneous right pneumothorax with prolonged air leak.  CLINICAL NOTE:  The patient is a 46 year old female, nonsmoker without chronic lung disease, who presented for the first spontaneous pneumothorax, which was on the right side.  She was treated with a chest tube at an outside hospital and transferred to this hospital.  She was treated with a chest tube for 72 hours with re-expansion of the lung and resolution of air leak.  The chest tube was removed and the patient was observed.  Upon observation over the next 48 hours, the patient had reaccumulation of some pneumothorax associated with shortness of breath. The pneumothorax was approximately 20% to 25%.  A CT scan showed a probable ruptured bleb in the right upper lobe.  Decision was made to recommend definitive surgery for treatment of her pneumothorax with stapling of blebs via VATS approach.  Prior to the operation, a second pigtail catheter was placed to re-expand the lung and to improve her symptoms.  I discussed the procedure VATS, bleb stapling, and pleurodesis with the patient including the indications, benefits, alternatives, and risks. She demonstrated her understanding and agreed to proceed with surgery.  DESCRIPTION OF PROCEDURE:  The  patient was brought to the operating room and placed supine on the operating room table.  General anesthesia was induced.  A double-lumen endotracheal tube was placed by the Anesthesia Team here.  The patient was rolled right side up and the right chest was prepped and draped after the pigtail catheter was removed.  A proper time-out was performed.  Small VATS portal incisions were made in the anterior axillary line beneath the tip of the scapula and posterior to the right scapular mid border.  The camera was inserted.  The lung was inspected.  There was a ruptured bleb with adhesion in the apex.  The remainder of the lung was carefully inspected and found to be without abnormality.  Using the VATS instruments and staplers, the bleb was resected from the apex of the lung.  The staple line was coated with a biologic adhesive- Coseal.  A second small bleb with an adhesion to the chest was also demonstrated at this time and this was also wedged off with a single staple line and covered with Coseal.  A mechanical and then talc pleurodesis was performed.  A new 28-French chest tube was positioned to the right chest apex and secured to the skin.  The lung was re-expanded under direct vision.  The VATS incisions were closed in layers using Vicryl and sterile  dressings were applied. The chest tube was connected to a Pleur-Evac suction drainage canister. The patient was extubated after she was rolled supine in the operating room.  A chest x-ray in the OR appeared satisfactory and she was transferred to the recovery room in stable condition.     Kerin Perna, M.D.     PV/MEDQ  D:  01/22/2017  T:  01/22/2017  Job:  [id number]

## 2017-01-22 NOTE — Progress Notes (Signed)
CT surgery p.m. Rounds  Status post right VATS, stapling of blebs Breathing comfortably Vital signs stable No airleak Continue current care

## 2017-01-22 NOTE — Transfer of Care (Signed)
Immediate Anesthesia Transfer of Care Note  Patient: Christine Guerrero  Procedure(s) Performed: Procedure(s): VIDEO ASSISTED THORACOSCOPY Stapling of blebs, right upper lobe wedge resection (Right) MECHANICAL AND TALC PLEURADESIS (Right)  Patient Location: PACU  Anesthesia Type:General  Level of Consciousness: awake, alert  and patient cooperative  Airway & Oxygen Therapy: Patient Spontanous Breathing  Post-op Assessment: Report given to RN and Post -op Vital signs reviewed and stable  Post vital signs: Reviewed and stable  Last Vitals:  Vitals:   01/21/17 2137 01/22/17 0513  BP: 128/77 137/90  Pulse:  76  Resp: 16 16  Temp: 36.6 C   SpO2:  97%    Last Pain:  Vitals:   01/22/17 0513  TempSrc: Oral  PainSc:       Patients Stated Pain Goal: 2 (01/21/17 1818)  Complications: No apparent anesthesia complications

## 2017-01-22 NOTE — Anesthesia Procedure Notes (Signed)
Procedure Name: Intubation Date/Time: 01/22/2017 7:45 AM Performed by: Salli Quarry Herbert Aguinaldo Pre-anesthesia Checklist: Patient identified, Emergency Drugs available, Suction available and Patient being monitored Patient Re-evaluated:Patient Re-evaluated prior to induction Oxygen Delivery Method: Circle System Utilized Preoxygenation: Pre-oxygenation with 100% oxygen Induction Type: IV induction Ventilation: Mask ventilation without difficulty Laryngoscope Size: Mac Grade View: Grade I Endobronchial tube: Left and Double lumen EBT and 37 Fr Number of attempts: 1 Airway Equipment and Method: Stylet and Oral airway Placement Confirmation: ETT inserted through vocal cords under direct vision,  positive ETCO2 and breath sounds checked- equal and bilateral Secured at: 29 cm Tube secured with: Tape Dental Injury: Teeth and Oropharynx as per pre-operative assessment

## 2017-01-22 NOTE — Brief Op Note (Signed)
01/16/2017 - 01/22/2017  9:36 AM  PATIENT:  Christine Guerrero  46 y.o. female  PRE-OPERATIVE DIAGNOSIS:  spontaneous pneumothorax  POST-OPERATIVE DIAGNOSIS:  spontaneous pneumothorax  PROCEDURE:  Procedure(s): VIDEO ASSISTED THORACOSCOPY Stapling of blebs, right upper lobe wedge resection (Right) MECHANICAL AND TALC PLEURADESIS (Right)  SURGEON:  Surgeon(s) and Role:    Kerin Perna, MD - Primary  PHYSICIAN ASSISTANT: Tonnia Bardin PA-C  ANESTHESIA:   general  EBL:  Total I/O In: 500 [I.V.:500] Out: 375 [Urine:225; Blood:150]  BLOOD ADMINISTERED:none  DRAINS: 1 28 F Chest Tube(s) in the RIGHT HEMITHORAX   LOCAL MEDICATIONS USED:  NONE  SPECIMEN:  Source of Specimen:  WEDGE RESECTION X2  DISPOSITION OF SPECIMEN:  PATHOLOGY  COUNTS:  YES  TOURNIQUET:  * No tourniquets in log *  DICTATION: .Other Dictation: Dictation Number [id number]  PLAN OF CARE: Admit to inpatient   PATIENT DISPOSITION:  PACU - hemodynamically stable.   Delay start of Pharmacological VTE agent (>24hrs) due to surgical blood loss or risk of bleeding: yes  COMPLICATIONS: NO KNOWN

## 2017-01-22 NOTE — Anesthesia Postprocedure Evaluation (Signed)
Anesthesia Post Note  Patient: Christine Guerrero  Procedure(s) Performed: Procedure(s) (LRB): VIDEO ASSISTED THORACOSCOPY Stapling of blebs, right upper lobe wedge resection (Right) MECHANICAL AND TALC PLEURADESIS (Right)     Patient location during evaluation: PACU Anesthesia Type: General Level of consciousness: awake and alert Pain management: pain level controlled Vital Signs Assessment: post-procedure vital signs reviewed and stable Respiratory status: spontaneous breathing, nonlabored ventilation, respiratory function stable and patient connected to nasal cannula oxygen Cardiovascular status: blood pressure returned to baseline and stable Postop Assessment: no signs of nausea or vomiting Anesthetic complications: no    Last Vitals:  Vitals:   01/22/17 1400 01/22/17 1500  BP: 111/64 122/75  Pulse:    Resp: 10 12  Temp:    SpO2:      Last Pain:  Vitals:   01/22/17 1111  TempSrc:   PainSc: 3                  [name]

## 2017-01-23 ENCOUNTER — Inpatient Hospital Stay (HOSPITAL_COMMUNITY): Payer: BLUE CROSS/BLUE SHIELD

## 2017-01-23 ENCOUNTER — Encounter (HOSPITAL_COMMUNITY): Payer: Self-pay | Admitting: Cardiothoracic Surgery

## 2017-01-23 LAB — POCT I-STAT 3, ART BLOOD GAS (G3+)
Acid-Base Excess: 1 mmol/L (ref 0.0–2.0)
Bicarbonate: 25.3 mmol/L (ref 20.0–28.0)
O2 Saturation: 99 %
Patient temperature: 98
TCO2: 26 mmol/L (ref 22–32)
pCO2 arterial: 38.6 mmHg (ref 32.0–48.0)
pH, Arterial: 7.424 (ref 7.350–7.450)
pO2, Arterial: 125 mmHg — ABNORMAL HIGH (ref 83.0–108.0)

## 2017-01-23 LAB — BASIC METABOLIC PANEL
ANION GAP: 6 (ref 5–15)
BUN: 8 mg/dL (ref 6–20)
CALCIUM: 8.1 mg/dL — AB (ref 8.9–10.3)
CO2: 24 mmol/L (ref 22–32)
Chloride: 106 mmol/L (ref 101–111)
Creatinine, Ser: 0.51 mg/dL (ref 0.44–1.00)
GFR calc non Af Amer: >60 mL/min (ref >60)
Glucose, Bld: 128 mg/dL — ABNORMAL HIGH (ref 65–99)
Potassium: 4 mmol/L (ref 3.5–5.1)
SODIUM: 136 mmol/L (ref 135–145)

## 2017-01-23 LAB — CBC
HCT: 33.5 % — ABNORMAL LOW (ref 36.0–46.0)
Hemoglobin: 10.9 g/dL — ABNORMAL LOW (ref 12.0–15.0)
MCH: 28.2 pg (ref 26.0–34.0)
MCHC: 32.5 g/dL (ref 30.0–36.0)
MCV: 86.6 fL (ref 78.0–100.0)
PLATELETS: 199 10*3/uL (ref 150–400)
RBC: 3.87 MIL/uL (ref 3.87–5.11)
RDW: 13.4 % (ref 11.5–15.5)
WBC: 8.4 10*3/uL (ref 4.0–10.5)

## 2017-01-23 MED ORDER — DEXTROSE-NACL 5-0.9 % IV SOLN: INTRAVENOUS | Status: DC

## 2017-01-23 NOTE — Progress Notes (Signed)
Pt received from ICU. Pt and visitor oriented to room and equipment. CT to water seal per order, no air leak. VSS. Telemetry applied, CCMD notified. Called to re-route dinner tray.  Leonidas Romberg, RN

## 2017-01-23 NOTE — Progress Notes (Signed)
Report given to RN Kathlene November on 4 [place] room 12

## 2017-01-23 NOTE — Care Management Note (Addendum)
Case Management Note  Patient Details  Name: Christine Guerrero MRN: [id number] Date of Birth: 1970/09/09  Subjective/Objective:  From home with spouse, pta indep, POD 1 VATS,  Stapling of bles, right upper lobe wedge resection, mechanical and talc pleuradesis.   Her spouse will be home with her at discharge, he works also , but when he is at work her mom and dad will be able to assist her per patient.   She has chest tube to water seal. She has medication coverage and PCP , Dr. Donnie Mesa.                Action/Plan: NCM will follow for dc needs.   Expected Discharge Date:                  Expected Discharge Plan:     In-House Referral:     Discharge planning Services  CM Consult  Post Acute Care Choice:    Choice offered to:     DME Arranged:    DME Agency:     HH Arranged:    HH Agency:     Status of Service:  In process, will continue to follow  If discussed at Long Length of Stay Meetings, dates discussed:    Additional Comments:  Leone Haven, RN 01/23/2017, 11:13 AM

## 2017-01-23 NOTE — Progress Notes (Signed)
Procedure(s) (LRB): VIDEO ASSISTED THORACOSCOPY Stapling of blebs, right upper lobe wedge resection (Right) MECHANICAL AND TALC PLEURADESIS (Right) POD # 1 Subjective: OOB in chair Postop pain improved CXR clear No air leak  Objective: Vital signs in last 24 hours: Temp:  [97 F (36.1 C)-98.9 F (37.2 C)] 98.7 F (37.1 C) (08/28 0744) Pulse Rate:  [49-66] 59 (08/28 0700) Cardiac Rhythm: Sinus bradycardia (08/28 0400) Resp:  [10-19] 17 (08/28 0700) BP: (95-166)/(55-93) 106/60 (08/28 0700) SpO2:  [93 %-100 %] 99 % (08/28 0700) Arterial Line BP: (73-187)/(58-108) 116/71 (08/28 0700)  Hemodynamic parameters for last 24 hours:  nsr  Intake/Output from previous day: 08/27 0701 - 08/28 0700 In: 2761 [P.O.:240; I.V.:2321] Out: 2894 [Urine:2690; Blood:150; Chest Tube:54] Intake/Output this shift: No intake/output data recorded.       Exam    General- alert and comfortable   Lungs- clear without rales, wheezes   Cor- regular rate and rhythm, no murmur , gallop   Abdomen- soft, non-tender   Extremities - warm, non-tender, minimal edema   Neuro- oriented, appropriate, no focal weakness   Lab Results:  Recent Labs  01/21/17 1329 01/23/17 0411  WBC 4.4 8.4  HGB 12.5 10.9*  HCT 37.9 33.5*  PLT 234 199   BMET:  Recent Labs  01/21/17 1329 01/23/17 0411  NA 136 136  K 4.1 4.0  CL 103 106  CO2 26 24  GLUCOSE 108* 128*  BUN 18 8  CREATININE 0.85 0.51  CALCIUM 8.8* 8.1*    PT/INR:  Recent Labs  01/21/17 1329  LABPROT 13.2  INR 1.00   ABG    Component Value Date/Time   PHART 7.424 01/23/2017 0407   HCO3 25.3 01/23/2017 0407   TCO2 26 01/23/2017 0407   ACIDBASEDEF 1.8 01/21/2017 1330   O2SAT 99.0 01/23/2017 0407   CBG (last 3)  No results for input(s): GLUCAP in the last 72 hours.  Assessment/Plan: S/P Procedure(s) (LRB): VIDEO ASSISTED THORACOSCOPY Stapling of blebs, right upper lobe wedge resection (Right) MECHANICAL AND TALC PLEURADESIS  (Rightchest tube to water seal CT to water seal tx to 4 E  LOS: 7 days    Kathlee Nations Trigt III 01/23/2017

## 2017-01-24 ENCOUNTER — Inpatient Hospital Stay (HOSPITAL_COMMUNITY): Payer: BLUE CROSS/BLUE SHIELD

## 2017-01-24 LAB — COMPREHENSIVE METABOLIC PANEL
ALBUMIN: 2.7 g/dL — AB (ref 3.5–5.0)
ALK PHOS: 72 U/L (ref 38–126)
ALT: 71 U/L — ABNORMAL HIGH (ref 14–54)
ANION GAP: 6 (ref 5–15)
AST: 57 U/L — ABNORMAL HIGH (ref 15–41)
BILIRUBIN TOTAL: 1 mg/dL (ref 0.3–1.2)
BUN: 14 mg/dL (ref 6–20)
CALCIUM: 8.1 mg/dL — AB (ref 8.9–10.3)
CO2: 24 mmol/L (ref 22–32)
Chloride: 108 mmol/L (ref 101–111)
Creatinine, Ser: 0.62 mg/dL (ref 0.44–1.00)
Glucose, Bld: 109 mg/dL — ABNORMAL HIGH (ref 65–99)
POTASSIUM: 3.8 mmol/L (ref 3.5–5.1)
Sodium: 138 mmol/L (ref 135–145)
TOTAL PROTEIN: 5.3 g/dL — AB (ref 6.5–8.1)

## 2017-01-24 LAB — CBC
HEMATOCRIT: 32.7 % — AB (ref 36.0–46.0)
Hemoglobin: 11 g/dL — ABNORMAL LOW (ref 12.0–15.0)
MCH: 29.3 pg (ref 26.0–34.0)
MCHC: 33.6 g/dL (ref 30.0–36.0)
MCV: 87 fL (ref 78.0–100.0)
Platelets: 192 10*3/uL (ref 150–400)
RBC: 3.76 MIL/uL — ABNORMAL LOW (ref 3.87–5.11)
RDW: 13.9 % (ref 11.5–15.5)
WBC: 6.5 10*3/uL (ref 4.0–10.5)

## 2017-01-24 NOTE — Discharge Instructions (Signed)
Thoracoscopy, Care After Refer to this sheet in the next few weeks. These instructions provide you with information about caring for yourself after your procedure. Your health care provider may also give you more specific instructions. Your treatment has been planned according to current medical practices, but problems sometimes occur. Call your health care provider if you have any problems or questions after your procedure. What can I expect after the procedure? After your procedure, it is common to feel sore for up to two weeks. Follow these instructions at home:  There are many different ways to close and cover an incision, including stitches (sutures), skin glue, and adhesive strips. Follow your health care provider's instructions about: ? Incision care. ? Bandage (dressing) changes and removal. ? Incision closure removal.  Check your incision area every day for signs of infection. Watch for: ? Redness, swelling, or pain. ? Fluid, blood, or pus.  Take medicines only as directed by your health care provider.  Try to cough often. Coughing helps to protect against lung infection (pneumonia). It may hurt to cough. If this happens, hold a pillow against your chest when you cough.  Take deep breaths. This also helps to protect against pneumonia.  If you were given an incentive spirometer, use it as directed by your health care provider.  Do not take baths, swim, or use a hot tub until your health care provider approves. You may take showers.  Avoid lifting until your health care provider approves.  Avoid driving until your health care provider approves.  Do not travel by airplane after the chest tube is removed until your health care provider approves. Contact a health care provider if:  You have a fever.  Pain medicines do not ease your pain.  You have redness, swelling, or increasing pain in your incision area.  You develop a cough that does not go away, or you are coughing up  mucus that is yellow or green. Get help right away if:  You have fluid, blood, or pus coming from your incision.  There is a bad smell coming from your incision or dressing.  You develop a rash.  You have difficulty breathing.  You cough up blood.  You develop light-headedness or you feel faint.  You develop chest pain.  Your heartbeat feels irregular or very fast. This information is not intended to replace advice given to you by your health care provider. Make sure you discuss any questions you have with your health care provider. Document Released: 12/02/2004 Document Revised: 01/16/2016 Document Reviewed: 01/28/2014 Elsevier Interactive Patient Education  2018 [employer]. Pneumothorax A pneumothorax, commonly called a collapsed lung, is a condition in which air leaks from a lung and builds up in the space between the lung and the chest wall (pleural space). The air in a pneumothorax is trapped outside the lung and takes up space, preventing the lung from fully expanding. This is a condition that usually occurs suddenly. The buildup of air may be small or large. A small pneumothorax may go away on its own. When a pneumothorax is larger, it will often require medical treatment and hospitalization. What are the causes? A pneumothorax can sometimes happen quickly with no apparent cause. People with underlying lung problems, particularly COPD or emphysema, are at higher risk of pneumothorax. However, pneumothorax can happen quickly even in people with no prior known lung problems. Trauma, surgery, medical procedures, or injury to the chest wall can also cause a pneumothorax. What are the signs or symptoms? Sometimes  a pneumothorax will have no symptoms. When symptoms are present, they can include:  Chest pain.  Shortness of breath.  Increased rate of breathing.  Bluish color to your lips or skin (cyanosis).  How is this diagnosed? Pneumothorax is usually diagnosed by a chest  X-ray or chest CT scan. Your health care provider will also take a medical history and perform a physical exam to determine why you may have a pneumothorax. How is this treated? A small pneumothorax may go away on its own without treatment. Extra oxygen can sometimes help a small pneumothorax go away more quickly. For a larger pneumothorax or a pneumothorax that is causing symptoms, a procedure is usually needed to drain the air.In some cases, the health care provider may drain the air using a needle. In other cases, a chest tube may be inserted into the pleural space. A chest tube is a small tube placed between the ribs and into the pleural space. This removes the extra air and allows the lung to expand back to its normal size. A large pneumothorax will usually require a hospital stay. If there is ongoing air leakage into the pleural space, then the chest tube may need to remain in place for several days until the air leak has healed. In some cases, surgery may be needed. Follow these instructions at home:  Only take over-the-counter or prescription medicines as directed by your health care provider.  If a cough or pain makes it difficult for you to sleep at night, try sleeping in a semi-upright position in a recliner or by using 2 or 3 pillows.  Rest and limit activity as directed by your health care provider.  If you had a chest tube and it was removed, ask your health care provider when it is okay to remove the dressing. Until your health care provider says you can remove the dressing, do not allow it to get wet.  Do not smoke. Smoking is a risk factor for pneumothorax.  Do not fly in an airplane or scuba dive until your health care provider says it is okay.  Follow up with your health care provider as directed. Get help right away if:  You have increasing chest pain or shortness of breath.  You have a cough that is not controlled with suppressants.  You begin coughing up blood.  You  have pain that is getting worse or is not controlled with medicines.  You cough up thick, discolored mucus (sputum) that is yellow to green in color.  You have redness, increasing pain, or discharge at the site where a chest tube had been in place (if your pneumothorax was treated with a chest tube).  The site where your chest tube was located opens up.  You feel air coming out of the site where the chest tube was placed.  You have a fever or persistent symptoms for more than 2-3 days.  You have a fever and your symptoms suddenly get worse. This information is not intended to replace advice given to you by your health care provider. Make sure you discuss any questions you have with your health care provider. Document Released: 05/15/2005 Document Revised: 10/21/2015 Document Reviewed: 10/08/2013 Elsevier Interactive Patient Education  [employer]2018 Elsevier Inc.

## 2017-01-24 NOTE — Progress Notes (Addendum)
[address]       [place] [address]             [phone]      2 Days Post-Op Procedure(s) (LRB): VIDEO ASSISTED THORACOSCOPY Stapling of blebs, right upper lobe wedge resection (Right) MECHANICAL AND TALC PLEURADESIS (Right) Subjective: Feels well   Objective: Vital signs in last 24 hours: Temp:  [98.2 F (36.8 C)-99.3 F (37.4 C)] 98.8 F (37.1 C) (08/29 0400) Pulse Rate:  [57-77] 69 (08/29 0400) Cardiac Rhythm: Normal sinus rhythm (08/29 0731) Resp:  [14-21] 16 (08/29 0510) BP: (97-134)/(54-66) 114/58 (08/29 0400) SpO2:  [92 %-100 %] 97 % (08/29 0510) FiO2 (%):  [100 %] 100 % (08/28 1236) Weight:  [139 lb (63 kg)] 139 lb (63 kg) (08/29 0400)  Hemodynamic parameters for last 24 hours:    Intake/Output from previous day: 08/28 0701 - 08/29 0700 In: 970 [P.O.:930; I.V.:40] Out: 11 [Urine:1; Chest Tube:10] Intake/Output this shift: No intake/output data recorded.  General appearance: alert, cooperative and no distress Heart: regular rate and rhythm Lungs: clear to auscultation bilaterally Abdomen: benign Extremities: no calf tenderness Wound: incis healing well  Lab Results:  Recent Labs  01/23/17 0411 01/24/17 0353  WBC 8.4 6.5  HGB 10.9* 11.0*  HCT 33.5* 32.7*  PLT 199 192   BMET:  Recent Labs  01/23/17 0411 01/24/17 0353  NA 136 138  K 4.0 3.8  CL 106 108  CO2 24 24  GLUCOSE 128* 109*  BUN 8 14  CREATININE 0.51 0.62  CALCIUM 8.1* 8.1*    PT/INR:  Recent Labs  01/21/17 1329  LABPROT 13.2  INR 1.00   ABG    Component Value Date/Time   PHART 7.424 01/23/2017 0407   HCO3 25.3 01/23/2017 0407   TCO2 26 01/23/2017 0407   ACIDBASEDEF 1.8 01/21/2017 1330   O2SAT 99.0 01/23/2017 0407   CBG (last 3)  No results for input(s): GLUCAP in the last 72 hours.  Meds Scheduled Meds: . acetaminophen  1,000 mg Oral Q6H   Or  . acetaminophen (TYLENOL) oral liquid 160 mg/5 mL  1,000 mg Oral Q6H  . aspirin EC  81 mg  Oral Daily  . bisacodyl  10 mg Oral Daily  . estradiol  1 mg Oral Daily  . fentaNYL   Intravenous Q4H  . guaiFENesin  600 mg Oral BID  . loratadine  10 mg Oral Daily  . pantoprazole  40 mg Oral Daily  . senna-docusate  1 tablet Oral QHS   Continuous Infusions: . dextrose 5 % and 0.9% NaCl 10 mL/hr at 01/23/17 0745  . potassium chloride     PRN Meds:.albuterol, diphenhydrAMINE **OR** diphenhydrAMINE, fentaNYL (SUBLIMAZE) injection, naloxone **AND** sodium chloride flush, ondansetron (ZOFRAN) IV, oxyCODONE, potassium chloride  Xrays Dg Chest Port 1 View  Result Date: 01/24/2017 CLINICAL DATA:  Right chest tube EXAM: PORTABLE CHEST 1 VIEW COMPARISON:  01/23/2017 FINDINGS: Postoperative changes on the right. Right chest tube in place. No pneumothorax. Subcutaneous emphysema throughout the right chest wall, stable. Elevation the right hemidiaphragm is stable with minimal right base atelectasis. Left lung is clear. IMPRESSION: Postoperative changes on the right with volume loss. No pneumothorax. Stable subcutaneous emphysema in the right chest wall. Electronically Signed   By: Charlett Nose M.D.   On: 01/24/2017 07:13   Dg Chest Port 1 View  Result Date: 01/23/2017 CLINICAL DATA:  Post right bleb resection and pleurodesis yesterday due to spontaneous pneumothorax. EXAM: PORTABLE  CHEST 1 VIEW COMPARISON:  Portable chest x-ray of January 22, 2017 FINDINGS: The lungs are well-expanded. The right lung exhibits no pneumothorax. The suture line is visible in the mid upper right lung. The tip of the chest tube lies in the apex. There is subcutaneous emphysema in the right axillary region. The left lung is clear. There is no mediastinal shift. The heart and pulmonary vascularity are normal. The observed bony thorax is unremarkable. IMPRESSION: No residual pneumothorax following bleb resection and pleurodesis on the right yesterday. The right chest tube is in stable position with the tip in the apex. Persistent  subcutaneous emphysema in the right axillary region. Electronically Signed   By: David  SwazilandJordan M.D.   On: 01/23/2017 07:43   Dg Chest Portable 1 View  Result Date: 01/22/2017 CLINICAL DATA:  Followup pneumothorax. EXAM: PORTABLE CHEST 1 VIEW COMPARISON:  01/21/2017 FINDINGS: Right-sided chest tube is in good position. No pneumothorax is identified. Surgical changes involving the right lung are noted. Resolving subcutaneous emphysema on the right side. Some residual pneumomediastinum is noted. The heart is normal in size. Mild vascular congestion but no overt pulmonary edema. IMPRESSION: Right-sided chest tube in good position with no postoperative pneumothorax. Residual pneumomediastinum and subcutaneous emphysema. Electronically Signed   By: Rudie MeyerP.  Gallerani M.D.   On: 01/22/2017 10:07    Assessment/Plan: S/P Procedure(s) (LRB): VIDEO ASSISTED THORACOSCOPY Stapling of blebs, right upper lobe wedge resection (Right) MECHANICAL AND TALC PLEURADESIS (Right)  1 doing well 2 CT- no air leak, no pntx on CXR- will d/c CT and PCA 3 labs are stable 4 hopefully home in am   LOS: 8 days    GOLD,WAYNE E 01/24/2017 patient examined and medical record reviewed,agree with above note. Kathlee Nationseter Van Trigt III 01/24/2017

## 2017-01-25 ENCOUNTER — Inpatient Hospital Stay (HOSPITAL_COMMUNITY): Payer: BLUE CROSS/BLUE SHIELD

## 2017-01-25 MED ORDER — OXYCODONE HCL 5 MG PO TABS: 5.0000 mg | ORAL_TABLET | Freq: Four times a day (QID) | ORAL | 0 refills | Status: DC | PRN

## 2017-01-25 MED ORDER — ASPIRIN 81 MG PO TBEC: 81.0000 mg | DELAYED_RELEASE_TABLET | Freq: Every day | ORAL | Status: DC

## 2017-01-25 NOTE — Progress Notes (Signed)
[address]       [place] [id number]             [phone]      3 Days Post-Op Procedure(s) (LRB): VIDEO ASSISTED THORACOSCOPY Stapling of blebs, right upper lobe wedge resection (Right) MECHANICAL AND TALC PLEURADESIS (Right) Subjective: Feels well   Objective: Vital signs in last 24 hours: Temp:  [98.6 F (37 C)-98.7 F (37.1 C)] 98.7 F (37.1 C) (08/30 0519) Pulse Rate:  [65] 65 (08/29 1309) Cardiac Rhythm: Normal sinus rhythm (08/29 1940) Resp:  [13-18] 13 (08/30 0519) BP: (112-137)/(53-85) 137/71 (08/30 0519) SpO2:  [98 %-100 %] 98 % (08/29 2349) Weight:  [138 lb 6.4 oz (62.8 kg)] 138 lb 6.4 oz (62.8 kg) (08/30 0519)  Hemodynamic parameters for last 24 hours:    Intake/Output from previous day: 08/29 0701 - 08/30 0700 In: 1080 [P.O.:1080] Out: 2 [Urine:2] Intake/Output this shift: No intake/output data recorded.  General appearance: alert, cooperative and no distress Heart: regular rate and rhythm Lungs: clear to auscultation bilaterally Wound: incis healing well  Lab Results:  Recent Labs  01/23/17 0411 01/24/17 0353  WBC 8.4 6.5  HGB 10.9* 11.0*  HCT 33.5* 32.7*  PLT 199 192   BMET:  Recent Labs  01/23/17 0411 01/24/17 0353  NA 136 138  K 4.0 3.8  CL 106 108  CO2 24 24  GLUCOSE 128* 109*  BUN 8 14  CREATININE 0.51 0.62  CALCIUM 8.1* 8.1*    PT/INR: No results for input(s): LABPROT, INR in the last 72 hours. ABG    Component Value Date/Time   PHART 7.424 01/23/2017 0407   HCO3 25.3 01/23/2017 0407   TCO2 26 01/23/2017 0407   ACIDBASEDEF 1.8 01/21/2017 1330   O2SAT 99.0 01/23/2017 0407   CBG (last 3)  No results for input(s): GLUCAP in the last 72 hours.  Meds Scheduled Meds: . acetaminophen  1,000 mg Oral Q6H   Or  . acetaminophen (TYLENOL) oral liquid 160 mg/5 mL  1,000 mg Oral Q6H  . aspirin EC  81 mg Oral Daily  . bisacodyl  10 mg Oral Daily  . estradiol  1 mg Oral Daily  . guaiFENesin  600 mg  Oral BID  . loratadine  10 mg Oral Daily  . pantoprazole  40 mg Oral Daily  . senna-docusate  1 tablet Oral QHS   Continuous Infusions: . dextrose 5 % and 0.9% NaCl Stopped (01/24/17 0935)  . potassium chloride     PRN Meds:.albuterol, fentaNYL (SUBLIMAZE) injection, oxyCODONE, potassium chloride  Xrays Dg Chest Port 1 View  Result Date: 01/24/2017 CLINICAL DATA:  Chest tube removed this morning. EXAM: PORTABLE CHEST 1 VIEW COMPARISON:  01/24/2017 FINDINGS: Right-sided chest tube has been removed. No visible pneumothorax. Extensive chest wall emphysema as seen previously. Postoperative changes at the right apex with volume loss. Negative left chest. Normal heart size. IMPRESSION: No visible pneumothorax after chest tube removal. Electronically Signed   By: Marnee SpringJonathon  Watts M.D.   On: 01/24/2017 14:55   Dg Chest Port 1 View  Result Date: 01/24/2017 CLINICAL DATA:  Right chest tube EXAM: PORTABLE CHEST 1 VIEW COMPARISON:  01/23/2017 FINDINGS: Postoperative changes on the right. Right chest tube in place. No pneumothorax. Subcutaneous emphysema throughout the right chest wall, stable. Elevation the right hemidiaphragm is stable with minimal right base atelectasis. Left lung is clear. IMPRESSION: Postoperative changes on the right with volume loss. No pneumothorax. Stable subcutaneous emphysema in the  right chest wall. Electronically Signed   By: Charlett Nose M.D.   On: 01/24/2017 07:13    Assessment/Plan: S/P Procedure(s) (LRB): VIDEO ASSISTED THORACOSCOPY Stapling of blebs, right upper lobe wedge resection (Right) MECHANICAL AND TALC PLEURADESIS (Right)   1 doing well, CXR no pntx, stable for discharge   LOS: 9 days    Menno Vanbergen E 01/25/2017

## 2017-01-25 NOTE — Care Management Note (Signed)
Case Management Note Original Note Created Leone Havenaylor, Deborah Clinton, RN 01/23/2017, 11:13 AM  Patient Details  Name: Christine Guerrero MRN: [id number] Date of Birth: [birth year]  Subjective/Objective:  From home with spouse, pta indep, POD 1 VATS,  Stapling of bles, right upper lobe wedge resection, mechanical and talc pleuradesis.   Her spouse will be home with her at discharge, he works also , but when he is at work her mom and dad will be able to assist her per patient.   She has chest tube to water seal. She has medication coverage and PCP , Dr. Donnie Mesaorton.                Action/Plan: NCM will follow for dc needs.   Expected Discharge Date:  01/25/17               Expected Discharge Plan:  Home/Self Care  In-House Referral:     Discharge planning Services  CM Consult  Post Acute Care Choice:    Choice offered to:     DME Arranged:    DME Agency:     HH Arranged:    HH Agency:     Status of Service:  Completed, signed off  If discussed at Long Length of Stay Meetings, dates discussed:  8/30  Discharge Disposition: home/self care   Additional Comments:  01/25/17- 1000- Donn PieriniKristi Vianney Kopecky RN, CM- pt for d/c home today- no CM needs noted for discharge.  Donn PieriniWebster, Latrease Kunde Spout SpringsHall, RN 01/25/2017, 12:57 PM [phone]

## 2017-01-31 ENCOUNTER — Ambulatory Visit: Payer: BLUE CROSS/BLUE SHIELD | Admitting: Cardiothoracic Surgery

## 2017-02-03 DIAGNOSIS — N3090 Cystitis, unspecified without hematuria: Secondary | ICD-10-CM | POA: Diagnosis not present

## 2017-02-03 DIAGNOSIS — N3001 Acute cystitis with hematuria: Secondary | ICD-10-CM | POA: Diagnosis not present

## 2017-02-06 ENCOUNTER — Other Ambulatory Visit: Payer: Self-pay

## 2017-02-06 DIAGNOSIS — J9383 Other pneumothorax: Secondary | ICD-10-CM

## 2017-02-07 ENCOUNTER — Ambulatory Visit
Admission: RE | Admit: 2017-02-07 | Discharge: 2017-02-07 | Disposition: A | Discharge status: Home / Self Care | Payer: BLUE CROSS/BLUE SHIELD | Source: Ambulatory Visit | Attending: Cardiothoracic Surgery | Admitting: Cardiothoracic Surgery

## 2017-02-07 ENCOUNTER — Encounter: Payer: Self-pay | Admitting: Cardiothoracic Surgery

## 2017-02-07 ENCOUNTER — Ambulatory Visit (INDEPENDENT_AMBULATORY_CARE_PROVIDER_SITE_OTHER): Payer: Self-pay | Admitting: Cardiothoracic Surgery

## 2017-02-07 VITALS — BP 126/82 | HR 64 | Resp 20 | Ht 66.0 in | Wt 138.0 lb

## 2017-02-07 DIAGNOSIS — J9382 Other air leak: Secondary | ICD-10-CM

## 2017-02-07 DIAGNOSIS — Z09 Encounter for follow-up examination after completed treatment for conditions other than malignant neoplasm: Secondary | ICD-10-CM

## 2017-02-07 DIAGNOSIS — J9311 Primary spontaneous pneumothorax: Secondary | ICD-10-CM

## 2017-02-07 DIAGNOSIS — J939 Pneumothorax, unspecified: Secondary | ICD-10-CM

## 2017-02-07 DIAGNOSIS — J9383 Other pneumothorax: Secondary | ICD-10-CM | POA: Diagnosis not present

## 2017-02-07 DIAGNOSIS — IMO0002 Reserved for concepts with insufficient information to code with codable children: Secondary | ICD-10-CM

## 2017-02-07 NOTE — Progress Notes (Signed)
PCP is Dorton, Margarita Mail., MD Referring Provider is Dorton, Margarita Mail., MD  Chief Complaint  Patient presents with  . Routine Post Op    s/p R VATS/STAPLING OF BLEBS/RULobe WEDGE RES.  01/22/17 with a CXR    HPI: First postop visit after right VATS, resection of apical bleb and pleurodesis for spontaneous pneumothorax Patient feels well Expected post thoracic surgical pain that she is taking ibuprofen to relieve No shortness of breath Incisions healing well Chest x-ray today shows small basilar space with small effusion Patient encouraged to remain active, use incentive spirometry, lift no more than 20 pounds, and return for follow-up chest x-ray in 2 weeks.  Past Medical History:  Diagnosis Date  . Angio-edema   . Childhood asthma   . Chondromalacia of both patellae   . Chronic bronchitis (HCC)    "usually q yr; haven't had it in the last 2 yrs" (01/16/2017)  . Heart murmur   . High cholesterol   . Interstitial cystitis   . Migraine    "stopped in 2015" (01/16/2017)  . Urticaria     Past Surgical History:  Procedure Laterality Date  . ABDOMINAL HYSTERECTOMY  2001  . APPENDECTOMY    . AUGMENTATION MAMMAPLASTY Bilateral   . CESAREAN SECTION  1999  . CHEST TUBE INSERTION Right 01/16/2017  . NASAL SEPTUM SURGERY  2012  . SINOSCOPY    . TALC PLEURODESIS Right 01/22/2017   Procedure: MECHANICAL AND TALC PLEURADESIS;  Surgeon: Kerin Perna, MD;  Location: [facility] OR;  Service: Thoracic;  Laterality: Right;  . TONSILLECTOMY    . VIDEO ASSISTED THORACOSCOPY Right 01/22/2017   Procedure: VIDEO ASSISTED THORACOSCOPY Stapling of blebs, right upper lobe wedge resection;  Surgeon: Kerin Perna, MD;  Location: [facility] OR;  Service: Thoracic;  Laterality: Right;    Family History  Problem Relation Age of Onset  . Melanoma Mother   . High blood pressure Mother   . High Cholesterol Mother   . Asthma Father   . Lung cancer Father   . Heart disease Father   . High blood pressure Father    . Diabetes Father   . Allergies Father        Seasonal  . Crohn's disease Maternal Grandfather   . Lung cancer Paternal Grandfather   . Diabetes Paternal Grandfather     Social History Social History  Substance Use Topics  . Smoking status: Never Smoker  . Smokeless tobacco: Never Used  . Alcohol use No    Current Outpatient Prescriptions  Medication Sig Dispense Refill  . aspirin EC 81 MG EC tablet Take 1 tablet (81 mg total) by mouth daily.    Marland Kitchen atorvastatin (LIPITOR) 10 MG tablet Take 1 tablet by mouth every other day.  5  . diclofenac (CATAFLAM) 50 MG tablet Take 50 mg by mouth daily.     Marland Kitchen estradiol (ESTRACE) 1 MG tablet Take 1 tablet by mouth daily.    . fexofenadine (ALLEGRA) 180 MG tablet Take 1 tablet (180 mg total) by mouth daily. 30 tablet 5  . omega-3 acid ethyl esters (LOVAZA) 1 g capsule Take 1 g by mouth 2 (two) times daily.     No current facility-administered medications for this visit.     Allergies  Allergen Reactions  . Codeine Nausea And Vomiting    Review of Systems  Improving after right VATS for spontaneous pneumothorax No fever, improved strength, no productive cough BP 126/82   Pulse 64   Resp 20  Ht 5\' 6"  (1.676 m)   Wt 138 lb (62.6 kg)   SpO2 99% Comment: RA  BMI 22.27 kg/m  Physical Exam       Exam    General- alert and comfortable   Lungs- clear without rales, wheezes   Cor- regular rate and rhythm, no murmur , gallop   Abdomen- soft, non-tender   Extremities - warm, non-tender, minimal edema   Neuro- oriented, appropriate, no focal weakness  Diagnostic Tests: Chest x-ray done today personally reviewed showing small basilar pneumothorax with small pleural effusion.  Impression: Doing well after wedge resection for removal of apical blebs after spontaneous pneumothorax  Plan: Continue normal daily activity avoid heavy lifting and return for chest x-ray in 2 weeks. No prescriptions provided at this office visit  Mikey BussingPeter  Van Trigt III, MD [facility] [phone]

## 2017-02-20 ENCOUNTER — Other Ambulatory Visit: Payer: Self-pay | Admitting: Cardiothoracic Surgery

## 2017-02-20 DIAGNOSIS — J9383 Other pneumothorax: Secondary | ICD-10-CM

## 2017-02-21 ENCOUNTER — Ambulatory Visit
Admission: RE | Admit: 2017-02-21 | Discharge: 2017-02-21 | Disposition: A | Discharge status: Home / Self Care | Payer: BLUE CROSS/BLUE SHIELD | Source: Ambulatory Visit | Attending: Cardiothoracic Surgery | Admitting: Cardiothoracic Surgery

## 2017-02-21 ENCOUNTER — Ambulatory Visit (INDEPENDENT_AMBULATORY_CARE_PROVIDER_SITE_OTHER): Payer: Self-pay | Admitting: Cardiothoracic Surgery

## 2017-02-21 VITALS — BP 116/75 | HR 61 | Resp 16 | Ht 66.0 in | Wt 138.0 lb

## 2017-02-21 DIAGNOSIS — J9383 Other pneumothorax: Secondary | ICD-10-CM

## 2017-02-21 DIAGNOSIS — Z09 Encounter for follow-up examination after completed treatment for conditions other than malignant neoplasm: Secondary | ICD-10-CM

## 2017-02-21 DIAGNOSIS — J939 Pneumothorax, unspecified: Secondary | ICD-10-CM | POA: Diagnosis not present

## 2017-02-21 DIAGNOSIS — IMO0002 Reserved for concepts with insufficient information to code with codable children: Secondary | ICD-10-CM

## 2017-02-21 DIAGNOSIS — J9382 Other air leak: Secondary | ICD-10-CM

## 2017-02-21 DIAGNOSIS — J9311 Primary spontaneous pneumothorax: Secondary | ICD-10-CM

## 2017-02-21 NOTE — Progress Notes (Signed)
PCP is Dorton, Margarita Mail., MD Referring Provider is Lorre Nick, MD  Chief Complaint  Patient presents with  . Routine Post Op    2 wk f/u with a CXR    ZOX:WRUEA one month postop visit after right VATS for resection of apical blebs and presentation with spontaneous right pneumothorax and persistent air leak after chest tube placement. Pathology showed benign disease-apical bleb disease. Chest x-ray today shows clear lung fields, full reexpansion of right lung without pneumothorax. Her exam shows the incisions well healed with clear breath sounds and normal vital signs. I demonstrated the patient's CT scan images to her during the office visit including images of the left lung which show no sign of apical bleb disease.   Past Medical History:  Diagnosis Date  . Angio-edema   . Childhood asthma   . Chondromalacia of both patellae   . Chronic bronchitis (HCC)    "usually q yr; haven't had it in the last 2 yrs" (01/16/2017)  . Heart murmur   . High cholesterol   . Interstitial cystitis   . Migraine    "stopped in 2015" (01/16/2017)  . Urticaria     Past Surgical History:  Procedure Laterality Date  . ABDOMINAL HYSTERECTOMY  2001  . APPENDECTOMY    . AUGMENTATION MAMMAPLASTY Bilateral   . CESAREAN SECTION  1999  . CHEST TUBE INSERTION Right 01/16/2017  . NASAL SEPTUM SURGERY  2012  . SINOSCOPY    . TALC PLEURODESIS Right 01/22/2017   Procedure: MECHANICAL AND TALC PLEURADESIS;  Surgeon: Kerin Perna, MD;  Location: [facility] OR;  Service: Thoracic;  Laterality: Right;  . TONSILLECTOMY    . VIDEO ASSISTED THORACOSCOPY Right 01/22/2017   Procedure: VIDEO ASSISTED THORACOSCOPY Stapling of blebs, right upper lobe wedge resection;  Surgeon: Kerin Perna, MD;  Location: [facility] OR;  Service: Thoracic;  Laterality: Right;    Family History  Problem Relation Age of Onset  . Melanoma Mother   . High blood pressure Mother   . High Cholesterol Mother   . Asthma Father   . Lung cancer  Father   . Heart disease Father   . High blood pressure Father   . Diabetes Father   . Allergies Father        Seasonal  . Crohn's disease Maternal Grandfather   . Lung cancer Paternal Grandfather   . Diabetes Paternal Grandfather     Social History Social History  Substance Use Topics  . Smoking status: Never Smoker  . Smokeless tobacco: Never Used  . Alcohol use No    Current Outpatient Prescriptions  Medication Sig Dispense Refill  . aspirin EC 81 MG EC tablet Take 1 tablet (81 mg total) by mouth daily.    Marland Kitchen atorvastatin (LIPITOR) 10 MG tablet Take 1 tablet by mouth every other day.  5  . diclofenac (CATAFLAM) 50 MG tablet Take 50 mg by mouth daily.     Marland Kitchen estradiol (ESTRACE) 1 MG tablet Take 1 tablet by mouth daily.    . fexofenadine (ALLEGRA) 180 MG tablet Take 1 tablet (180 mg total) by mouth daily. 30 tablet 5  . omega-3 acid ethyl esters (LOVAZA) 1 g capsule Take 1 g by mouth 2 (two) times daily.     No current facility-administered medications for this visit.     Allergies  Allergen Reactions  . Codeine Nausea And Vomiting    Review of Systems  No postop pain No shortness of breath No weakness  BP 116/75 (  BP Location: Left Arm, Patient Position: Sitting, Cuff Size: Large)   Pulse 61   Resp 16   Ht  (1.676 m)   Wt 138 lb (62.6 kg)   SpO2 99% Comment: ON RA  BMI 22.27 kg/m  Physical Exam      Exam    General- alert and comfortable   Lungs- clear without rales, wheezes   Cor- regular rate and rhythm, no murmur , gallop   Abdomen- soft, non-tender   Extremities - warm, non-tender, minimal edema   Neuro- oriented, appropriate, no focal weakness   Diagnostic Tests: Chest x-ray performed today, images personally reviewed and discussed with patient Right lung has healed without pneumothorax  Impression: Excellent recovery one month after right VATS for spontaneous pneumothorax and bleb disease  Plan: No further follow-up for chest x-rays  needed No restrictions to activity at this time She will report symptoms of recurrent pneumothorax and obtain chest x-ray if these occur.  Mikey Bussing, MD [facility] [phone]

## 2017-02-28 DIAGNOSIS — M778 Other enthesopathies, not elsewhere classified: Secondary | ICD-10-CM | POA: Diagnosis not present

## 2017-02-28 DIAGNOSIS — M7541 Impingement syndrome of right shoulder: Secondary | ICD-10-CM | POA: Diagnosis not present

## 2017-03-02 DIAGNOSIS — Z1231 Encounter for screening mammogram for malignant neoplasm of breast: Secondary | ICD-10-CM | POA: Diagnosis not present

## 2017-07-05 DIAGNOSIS — L821 Other seborrheic keratosis: Secondary | ICD-10-CM | POA: Diagnosis not present

## 2017-07-05 DIAGNOSIS — D485 Neoplasm of uncertain behavior of skin: Secondary | ICD-10-CM | POA: Diagnosis not present

## 2017-07-05 DIAGNOSIS — L570 Actinic keratosis: Secondary | ICD-10-CM | POA: Diagnosis not present

## 2017-07-05 DIAGNOSIS — D225 Melanocytic nevi of trunk: Secondary | ICD-10-CM | POA: Diagnosis not present

## 2017-07-05 DIAGNOSIS — L578 Other skin changes due to chronic exposure to nonionizing radiation: Secondary | ICD-10-CM | POA: Diagnosis not present

## 2018-01-03 DIAGNOSIS — E785 Hyperlipidemia, unspecified: Secondary | ICD-10-CM | POA: Diagnosis not present

## 2018-01-03 DIAGNOSIS — E611 Iron deficiency: Secondary | ICD-10-CM | POA: Diagnosis not present

## 2018-01-03 DIAGNOSIS — D649 Anemia, unspecified: Secondary | ICD-10-CM | POA: Diagnosis not present

## 2018-01-03 DIAGNOSIS — H6120 Impacted cerumen, unspecified ear: Secondary | ICD-10-CM | POA: Diagnosis not present

## 2018-01-03 DIAGNOSIS — D519 Vitamin B12 deficiency anemia, unspecified: Secondary | ICD-10-CM | POA: Diagnosis not present

## 2018-01-03 DIAGNOSIS — M719 Bursopathy, unspecified: Secondary | ICD-10-CM | POA: Diagnosis not present

## 2018-02-08 DIAGNOSIS — Z1231 Encounter for screening mammogram for malignant neoplasm of breast: Secondary | ICD-10-CM | POA: Diagnosis not present

## 2018-02-08 DIAGNOSIS — Z01419 Encounter for gynecological examination (general) (routine) without abnormal findings: Secondary | ICD-10-CM | POA: Diagnosis not present

## 2018-02-11 DIAGNOSIS — Z23 Encounter for immunization: Secondary | ICD-10-CM | POA: Diagnosis not present

## 2019-02-24 DIAGNOSIS — Z23 Encounter for immunization: Secondary | ICD-10-CM | POA: Diagnosis not present

## 2019-02-24 DIAGNOSIS — Z0000 Encounter for general adult medical examination without abnormal findings: Secondary | ICD-10-CM | POA: Diagnosis not present

## 2019-02-24 DIAGNOSIS — Z681 Body mass index (BMI) 19 or less, adult: Secondary | ICD-10-CM | POA: Diagnosis not present

## 2019-02-24 DIAGNOSIS — Z1331 Encounter for screening for depression: Secondary | ICD-10-CM | POA: Diagnosis not present

## 2019-02-24 DIAGNOSIS — Z13220 Encounter for screening for lipoid disorders: Secondary | ICD-10-CM | POA: Diagnosis not present

## 2019-03-06 DIAGNOSIS — H5213 Myopia, bilateral: Secondary | ICD-10-CM | POA: Diagnosis not present

## 2019-03-14 DIAGNOSIS — Z1231 Encounter for screening mammogram for malignant neoplasm of breast: Secondary | ICD-10-CM | POA: Diagnosis not present

## 2019-03-14 DIAGNOSIS — Z01419 Encounter for gynecological examination (general) (routine) without abnormal findings: Secondary | ICD-10-CM | POA: Diagnosis not present

## 2019-05-06 DIAGNOSIS — M9903 Segmental and somatic dysfunction of lumbar region: Secondary | ICD-10-CM | POA: Diagnosis not present

## 2019-05-06 DIAGNOSIS — M5416 Radiculopathy, lumbar region: Secondary | ICD-10-CM | POA: Diagnosis not present

## 2019-05-06 DIAGNOSIS — M9905 Segmental and somatic dysfunction of pelvic region: Secondary | ICD-10-CM | POA: Diagnosis not present

## 2019-05-06 DIAGNOSIS — M62830 Muscle spasm of back: Secondary | ICD-10-CM | POA: Diagnosis not present

## 2019-05-09 DIAGNOSIS — M9903 Segmental and somatic dysfunction of lumbar region: Secondary | ICD-10-CM | POA: Diagnosis not present

## 2019-05-09 DIAGNOSIS — M9905 Segmental and somatic dysfunction of pelvic region: Secondary | ICD-10-CM | POA: Diagnosis not present

## 2019-05-09 DIAGNOSIS — M62830 Muscle spasm of back: Secondary | ICD-10-CM | POA: Diagnosis not present

## 2019-05-09 DIAGNOSIS — M5416 Radiculopathy, lumbar region: Secondary | ICD-10-CM | POA: Diagnosis not present

## 2019-05-12 DIAGNOSIS — M9903 Segmental and somatic dysfunction of lumbar region: Secondary | ICD-10-CM | POA: Diagnosis not present

## 2019-05-12 DIAGNOSIS — M5416 Radiculopathy, lumbar region: Secondary | ICD-10-CM | POA: Diagnosis not present

## 2019-05-12 DIAGNOSIS — M9905 Segmental and somatic dysfunction of pelvic region: Secondary | ICD-10-CM | POA: Diagnosis not present

## 2019-05-12 DIAGNOSIS — M62830 Muscle spasm of back: Secondary | ICD-10-CM | POA: Diagnosis not present

## 2019-05-21 DIAGNOSIS — K591 Functional diarrhea: Secondary | ICD-10-CM | POA: Diagnosis not present

## 2019-05-27 DIAGNOSIS — R1013 Epigastric pain: Secondary | ICD-10-CM | POA: Diagnosis not present

## 2019-05-27 DIAGNOSIS — Z1231 Encounter for screening mammogram for malignant neoplasm of breast: Secondary | ICD-10-CM | POA: Diagnosis not present

## 2019-06-12 DIAGNOSIS — R109 Unspecified abdominal pain: Secondary | ICD-10-CM | POA: Diagnosis not present

## 2019-06-12 DIAGNOSIS — J45909 Unspecified asthma, uncomplicated: Secondary | ICD-10-CM | POA: Diagnosis not present

## 2019-06-12 DIAGNOSIS — K219 Gastro-esophageal reflux disease without esophagitis: Secondary | ICD-10-CM | POA: Diagnosis not present

## 2019-06-12 DIAGNOSIS — R1013 Epigastric pain: Secondary | ICD-10-CM | POA: Diagnosis not present

## 2019-06-12 DIAGNOSIS — R0789 Other chest pain: Secondary | ICD-10-CM | POA: Diagnosis not present

## 2019-06-12 DIAGNOSIS — R1012 Left upper quadrant pain: Secondary | ICD-10-CM | POA: Diagnosis not present

## 2019-07-07 DIAGNOSIS — J329 Chronic sinusitis, unspecified: Secondary | ICD-10-CM | POA: Diagnosis not present

## 2019-07-07 DIAGNOSIS — J40 Bronchitis, not specified as acute or chronic: Secondary | ICD-10-CM | POA: Diagnosis not present

## 2019-07-14 DIAGNOSIS — K219 Gastro-esophageal reflux disease without esophagitis: Secondary | ICD-10-CM | POA: Diagnosis not present

## 2019-07-16 DIAGNOSIS — R1011 Right upper quadrant pain: Secondary | ICD-10-CM | POA: Diagnosis not present

## 2019-07-16 DIAGNOSIS — R1013 Epigastric pain: Secondary | ICD-10-CM | POA: Diagnosis not present

## 2019-11-19 DIAGNOSIS — F410 Panic disorder [episodic paroxysmal anxiety] without agoraphobia: Secondary | ICD-10-CM | POA: Diagnosis not present

## 2019-11-19 DIAGNOSIS — Z681 Body mass index (BMI) 19 or less, adult: Secondary | ICD-10-CM | POA: Diagnosis not present

## 2020-01-28 DIAGNOSIS — Z23 Encounter for immunization: Secondary | ICD-10-CM | POA: Diagnosis not present

## 2020-01-28 DIAGNOSIS — F410 Panic disorder [episodic paroxysmal anxiety] without agoraphobia: Secondary | ICD-10-CM | POA: Diagnosis not present

## 2020-01-28 DIAGNOSIS — Z681 Body mass index (BMI) 19 or less, adult: Secondary | ICD-10-CM | POA: Diagnosis not present

## 2020-02-25 DIAGNOSIS — Z23 Encounter for immunization: Secondary | ICD-10-CM | POA: Diagnosis not present

## 2020-03-22 DIAGNOSIS — Z1231 Encounter for screening mammogram for malignant neoplasm of breast: Secondary | ICD-10-CM | POA: Diagnosis not present

## 2020-03-22 DIAGNOSIS — Z01419 Encounter for gynecological examination (general) (routine) without abnormal findings: Secondary | ICD-10-CM | POA: Diagnosis not present

## 2020-03-25 DIAGNOSIS — Z0000 Encounter for general adult medical examination without abnormal findings: Secondary | ICD-10-CM | POA: Diagnosis not present

## 2020-04-01 DIAGNOSIS — H52223 Regular astigmatism, bilateral: Secondary | ICD-10-CM | POA: Diagnosis not present

## 2020-04-05 ENCOUNTER — Emergency Department (HOSPITAL_COMMUNITY): Payer: BC Managed Care – PPO

## 2020-04-05 ENCOUNTER — Encounter (HOSPITAL_COMMUNITY): Payer: Self-pay | Admitting: Emergency Medicine

## 2020-04-05 ENCOUNTER — Inpatient Hospital Stay (HOSPITAL_COMMUNITY)
Admission: EM | Admit: 2020-04-05 | Discharge: 2020-04-17 | DRG: 165 | Disposition: A | Discharge status: Home / Self Care | Payer: BC Managed Care – PPO | Attending: Internal Medicine | Admitting: Internal Medicine

## 2020-04-05 ENCOUNTER — Other Ambulatory Visit: Payer: Self-pay

## 2020-04-05 DIAGNOSIS — Z808 Family history of malignant neoplasm of other organs or systems: Secondary | ICD-10-CM | POA: Diagnosis not present

## 2020-04-05 DIAGNOSIS — J984 Other disorders of lung: Secondary | ICD-10-CM | POA: Diagnosis not present

## 2020-04-05 DIAGNOSIS — J9383 Other pneumothorax: Secondary | ICD-10-CM | POA: Diagnosis not present

## 2020-04-05 DIAGNOSIS — Z978 Presence of other specified devices: Secondary | ICD-10-CM | POA: Diagnosis not present

## 2020-04-05 DIAGNOSIS — J9811 Atelectasis: Secondary | ICD-10-CM | POA: Diagnosis not present

## 2020-04-05 DIAGNOSIS — M549 Dorsalgia, unspecified: Secondary | ICD-10-CM

## 2020-04-05 DIAGNOSIS — Z938 Other artificial opening status: Secondary | ICD-10-CM

## 2020-04-05 DIAGNOSIS — Z825 Family history of asthma and other chronic lower respiratory diseases: Secondary | ICD-10-CM | POA: Diagnosis not present

## 2020-04-05 DIAGNOSIS — L509 Urticaria, unspecified: Secondary | ICD-10-CM | POA: Diagnosis not present

## 2020-04-05 DIAGNOSIS — J9382 Other air leak: Secondary | ICD-10-CM | POA: Diagnosis not present

## 2020-04-05 DIAGNOSIS — F32A Depression, unspecified: Secondary | ICD-10-CM | POA: Diagnosis not present

## 2020-04-05 DIAGNOSIS — Z20822 Contact with and (suspected) exposure to covid-19: Secondary | ICD-10-CM | POA: Diagnosis not present

## 2020-04-05 DIAGNOSIS — E785 Hyperlipidemia, unspecified: Secondary | ICD-10-CM

## 2020-04-05 DIAGNOSIS — J939 Pneumothorax, unspecified: Secondary | ICD-10-CM

## 2020-04-05 DIAGNOSIS — Z801 Family history of malignant neoplasm of trachea, bronchus and lung: Secondary | ICD-10-CM

## 2020-04-05 DIAGNOSIS — Z83438 Family history of other disorder of lipoprotein metabolism and other lipidemia: Secondary | ICD-10-CM

## 2020-04-05 DIAGNOSIS — R0602 Shortness of breath: Secondary | ICD-10-CM | POA: Diagnosis not present

## 2020-04-05 DIAGNOSIS — Z833 Family history of diabetes mellitus: Secondary | ICD-10-CM | POA: Diagnosis not present

## 2020-04-05 DIAGNOSIS — Z7982 Long term (current) use of aspirin: Secondary | ICD-10-CM | POA: Diagnosis not present

## 2020-04-05 DIAGNOSIS — Z4682 Encounter for fitting and adjustment of non-vascular catheter: Secondary | ICD-10-CM | POA: Diagnosis not present

## 2020-04-05 DIAGNOSIS — R918 Other nonspecific abnormal finding of lung field: Secondary | ICD-10-CM | POA: Diagnosis not present

## 2020-04-05 DIAGNOSIS — Z79899 Other long term (current) drug therapy: Secondary | ICD-10-CM | POA: Diagnosis not present

## 2020-04-05 DIAGNOSIS — J439 Emphysema, unspecified: Secondary | ICD-10-CM | POA: Diagnosis not present

## 2020-04-05 DIAGNOSIS — Z885 Allergy status to narcotic agent status: Secondary | ICD-10-CM | POA: Diagnosis not present

## 2020-04-05 DIAGNOSIS — Z8249 Family history of ischemic heart disease and other diseases of the circulatory system: Secondary | ICD-10-CM | POA: Diagnosis not present

## 2020-04-05 DIAGNOSIS — M2241 Chondromalacia patellae, right knee: Secondary | ICD-10-CM | POA: Diagnosis not present

## 2020-04-05 DIAGNOSIS — R0981 Nasal congestion: Secondary | ICD-10-CM | POA: Diagnosis not present

## 2020-04-05 DIAGNOSIS — J42 Unspecified chronic bronchitis: Secondary | ICD-10-CM | POA: Diagnosis not present

## 2020-04-05 DIAGNOSIS — R051 Acute cough: Secondary | ICD-10-CM | POA: Diagnosis not present

## 2020-04-05 DIAGNOSIS — Z09 Encounter for follow-up examination after completed treatment for conditions other than malignant neoplasm: Secondary | ICD-10-CM

## 2020-04-05 DIAGNOSIS — J9311 Primary spontaneous pneumothorax: Secondary | ICD-10-CM | POA: Diagnosis not present

## 2020-04-05 DIAGNOSIS — F419 Anxiety disorder, unspecified: Secondary | ICD-10-CM

## 2020-04-05 DIAGNOSIS — Z9689 Presence of other specified functional implants: Secondary | ICD-10-CM

## 2020-04-05 LAB — HIV ANTIBODY (ROUTINE TESTING W REFLEX): HIV Screen 4th Generation wRfx: NONREACTIVE

## 2020-04-05 LAB — RESPIRATORY PANEL BY RT PCR (FLU A&B, COVID)
Influenza A by PCR: NEGATIVE
Influenza B by PCR: NEGATIVE
SARS Coronavirus 2 by RT PCR: NEGATIVE

## 2020-04-05 LAB — BASIC METABOLIC PANEL
Anion gap: 10 (ref 5–15)
BUN: 14 mg/dL (ref 6–20)
CO2: 25 mmol/L (ref 22–32)
Calcium: 9 mg/dL (ref 8.9–10.3)
Chloride: 104 mmol/L (ref 98–111)
Creatinine, Ser: 0.75 mg/dL (ref 0.44–1.00)
GFR, Estimated: >60 mL/min (ref >60)
Glucose, Bld: 83 mg/dL (ref 70–99)
Potassium: 3.5 mmol/L (ref 3.5–5.1)
Sodium: 139 mmol/L (ref 135–145)

## 2020-04-05 LAB — CBC
HCT: 40.1 % (ref 36.0–46.0)
Hemoglobin: 13 g/dL (ref 12.0–15.0)
MCH: 29.5 pg (ref 26.0–34.0)
MCHC: 32.4 g/dL (ref 30.0–36.0)
MCV: 90.9 fL (ref 80.0–100.0)
Platelets: 241 10*3/uL (ref 150–400)
RBC: 4.41 MIL/uL (ref 3.87–5.11)
RDW: 13.6 % (ref 11.5–15.5)
WBC: 5.4 10*3/uL (ref 4.0–10.5)
nRBC: 0 % (ref 0.0–0.2)

## 2020-04-05 LAB — TROPONIN I (HIGH SENSITIVITY): Troponin I (High Sensitivity): 5 ng/L (ref <18)

## 2020-04-05 MED ORDER — DICLOFENAC POTASSIUM 50 MG PO TABS: 50.0000 mg | ORAL_TABLET | Freq: Every day | ORAL | Status: DC

## 2020-04-05 MED ORDER — ESTRADIOL 1 MG PO TABS
1.0000 mg | ORAL_TABLET | Freq: Every day | ORAL | Status: DC
  Administered 2020-04-06 – 2020-04-17 (×12): 1 mg via ORAL
  Filled 2020-04-05 (×13): qty 1

## 2020-04-05 MED ORDER — ONDANSETRON HCL 4 MG/2ML IJ SOLN
4.0000 mg | Freq: Four times a day (QID) | INTRAMUSCULAR | Status: DC | PRN
  Administered 2020-04-14: 4 mg via INTRAVENOUS
  Filled 2020-04-05: qty 2

## 2020-04-05 MED ORDER — LORATADINE 10 MG PO TABS
10.0000 mg | ORAL_TABLET | Freq: Every day | ORAL | Status: DC
  Administered 2020-04-06 – 2020-04-17 (×12): 10 mg via ORAL
  Filled 2020-04-05 (×12): qty 1

## 2020-04-05 MED ORDER — LORAZEPAM 0.5 MG PO TABS: 0.5000 mg | ORAL_TABLET | Freq: Every day | ORAL | Status: DC | PRN

## 2020-04-05 MED ORDER — ACETAMINOPHEN 325 MG PO TABS
650.0000 mg | ORAL_TABLET | Freq: Four times a day (QID) | ORAL | Status: DC | PRN
  Administered 2020-04-06 (×2): 650 mg via ORAL
  Filled 2020-04-05 (×2): qty 2

## 2020-04-05 MED ORDER — EZETIMIBE 10 MG PO TABS
10.0000 mg | ORAL_TABLET | Freq: Every evening | ORAL | Status: DC
  Administered 2020-04-05 – 2020-04-16 (×11): 10 mg via ORAL
  Filled 2020-04-05 (×12): qty 1

## 2020-04-05 MED ORDER — ACETAMINOPHEN 650 MG RE SUPP: 650.0000 mg | Freq: Four times a day (QID) | RECTAL | Status: DC | PRN

## 2020-04-05 MED ORDER — OMEGA-3-ACID ETHYL ESTERS 1 G PO CAPS
1.0000 g | ORAL_CAPSULE | Freq: Two times a day (BID) | ORAL | Status: DC
  Administered 2020-04-05 – 2020-04-08 (×6): 1 g via ORAL
  Filled 2020-04-05 (×7): qty 1

## 2020-04-05 MED ORDER — MORPHINE SULFATE (PF) 2 MG/ML IV SOLN
2.0000 mg | INTRAVENOUS | Status: DC | PRN
  Administered 2020-04-06 – 2020-04-16 (×5): 2 mg via INTRAVENOUS
  Filled 2020-04-05 (×5): qty 1

## 2020-04-05 MED ORDER — DICLOFENAC SODIUM 50 MG PO TBEC
50.0000 mg | DELAYED_RELEASE_TABLET | Freq: Every day | ORAL | Status: DC
  Filled 2020-04-05: qty 1

## 2020-04-05 MED ORDER — PANTOPRAZOLE SODIUM 40 MG PO TBEC
40.0000 mg | DELAYED_RELEASE_TABLET | Freq: Every day | ORAL | Status: DC
  Administered 2020-04-06 – 2020-04-17 (×12): 40 mg via ORAL
  Filled 2020-04-05 (×12): qty 1

## 2020-04-05 MED ORDER — ONDANSETRON HCL 4 MG PO TABS: 4.0000 mg | ORAL_TABLET | Freq: Four times a day (QID) | ORAL | Status: DC | PRN

## 2020-04-05 MED ORDER — SERTRALINE HCL 25 MG PO TABS
25.0000 mg | ORAL_TABLET | Freq: Every day | ORAL | Status: DC
  Administered 2020-04-06 – 2020-04-17 (×12): 25 mg via ORAL
  Filled 2020-04-05 (×12): qty 1

## 2020-04-05 NOTE — ED Provider Notes (Signed)
[facility] EMERGENCY DEPARTMENT Provider Note   CSN: [id number] Arrival date & time: 04/05/20  1506     History Chief Complaint  Patient presents with  . Shortness of Breath    Christine Guerrero is a 49 y.o. female.  HPI Patient is a 49 year old female with a history of migraine, childhood asthma, bronchitis, non-smoker no history of known alpha-1 antitrypsin deficiency, history of spontaneous pneumothorax on the right side 2 years ago.  Patient had right-sided VATS for resection of apical blebs in 2018 at that time had complete resolution of her pneumothorax.  Patient is presented today with approximately 2 weeks of shortness of breath that she originally attributed to congestion and allergic rhinitis however she became profoundly short of breath including shortness of breath at rest over the weekend.  She went to urgent care and had a chest x-ray done which showed a pneumothorax of the right side.   She states that she has some associated right side pain.  It is not significantly pleuritic she denies any significant cough or hemoptysis.  She denies any lightheadedness or dizziness.  When asked specifically she does states she has some mild chest pain but she states it is an achy pain.  Nonpleuritic or sharp.     Past Medical History:  Diagnosis Date  . Angio-edema   . Childhood asthma   . Chondromalacia of both patellae   . Chronic bronchitis (HCC)    "usually q yr; haven't had it in the last 2 yrs" (01/16/2017)  . Heart murmur   . High cholesterol   . Interstitial cystitis   . Migraine    "stopped in 2015" (01/16/2017)  . Urticaria     Patient Active Problem List   Diagnosis Date Noted  . Spontaneous pneumothorax 01/22/2017  . Pneumothorax on right 01/16/2017  . Urticaria 02/08/2016  . Angioedema 02/08/2016    Past Surgical History:  Procedure Laterality Date  . ABDOMINAL HYSTERECTOMY  2001  . APPENDECTOMY    . AUGMENTATION MAMMAPLASTY Bilateral    . CESAREAN SECTION  1999  . CHEST TUBE INSERTION Right 01/16/2017  . NASAL SEPTUM SURGERY  2012  . SINOSCOPY    . TALC PLEURODESIS Right 01/22/2017   Procedure: MECHANICAL AND TALC PLEURADESIS;  Surgeon: Kerin Perna, MD;  Location: [facility] OR;  Service: Thoracic;  Laterality: Right;  . TONSILLECTOMY    . VIDEO ASSISTED THORACOSCOPY Right 01/22/2017   Procedure: VIDEO ASSISTED THORACOSCOPY Stapling of blebs, right upper lobe wedge resection;  Surgeon: Kerin Perna, MD;  Location: [facility] OR;  Service: Thoracic;  Laterality: Right;     OB History   No obstetric history on file.     Family History  Problem Relation Age of Onset  . Melanoma Mother   . High blood pressure Mother   . High Cholesterol Mother   . Asthma Father   . Lung cancer Father   . Heart disease Father   . High blood pressure Father   . Diabetes Father   . Allergies Father        Seasonal  . Crohn's disease Maternal Grandfather   . Lung cancer Paternal Grandfather   . Diabetes Paternal Grandfather     Social History   Tobacco Use  . Smoking status: Never Smoker  . Smokeless tobacco: Never Used  Vaping Use  . Vaping Use: Never used  Substance Use Topics  . Alcohol use: No  . Drug use: No    Home Medications Prior to  Admission medications   Medication Sig Start Date End Date Taking? Authorizing Provider  aspirin EC 81 MG EC tablet Take 1 tablet (81 mg total) by mouth daily. 01/25/17  Yes Gold, Wayne E, PA-C  diclofenac (CATAFLAM) 50 MG tablet Take 50 mg by mouth daily.    Yes [provider]  estradiol (ESTRACE) 1 MG tablet Take 1 tablet by mouth daily.   Yes [provider]  ezetimibe (ZETIA) 10 MG tablet Take 10 mg by mouth every evening.   Yes [provider]  fexofenadine (ALLEGRA) 180 MG tablet Take 1 tablet (180 mg total) by mouth daily. 02/08/16  Yes Padgett, Pilar Grammes, MD  LORazepam (ATIVAN) 0.5 MG tablet Take 0.5 mg by mouth daily as needed for anxiety. 01/15/20   Yes [provider]  omega-3 acid ethyl esters (LOVAZA) 1 g capsule Take 1 g by mouth 2 (two) times daily.   Yes [provider]  omeprazole (PRILOSEC) 20 MG capsule Take 20 mg by mouth in the morning. 03/17/20  Yes [provider]  sertraline (ZOLOFT) 25 MG tablet Take 25 mg by mouth daily. 01/28/20  Yes [provider]    Allergies    Codeine  Review of Systems   Review of Systems  Constitutional: Negative for chills and fever.  HENT: Negative for congestion.   Eyes: Negative for pain.  Respiratory: Positive for shortness of breath. Negative for cough.   Cardiovascular: Negative for chest pain and leg swelling.  Gastrointestinal: Negative for abdominal distention, abdominal pain and vomiting.  Genitourinary: Negative for dysuria.  Musculoskeletal: Negative for myalgias.  Skin: Negative for rash.  Neurological: Negative for dizziness and headaches.    Physical Exam Updated Vital Signs BP (!) 165/107   Pulse (!) 58   Temp 98.3 F (36.8 C) (Oral)   Resp (!) 24   Ht 5\' 6"  (1.676 m)   Wt 62.6 kg   SpO2 97%   BMI 22.28 kg/m   Physical Exam  ED Results / Procedures / Treatments   Labs (all labs ordered are listed, but only abnormal results are displayed) Labs Reviewed  RESPIRATORY PANEL BY RT PCR (FLU A&B, COVID)  BASIC METABOLIC PANEL  CBC  I-STAT BETA HCG BLOOD, ED ([facility], [facility], [facility] ONLY)  TROPONIN I (HIGH SENSITIVITY)    EKG EKG Interpretation  Date/Time:  Monday April 05 2020 15:31:56 EST Ventricular Rate:  63 PR Interval:  142 QRS Duration: 82 QT Interval:  396 QTC Calculation: 405 R Axis:   95 Text Interpretation: Normal sinus rhythm Biatrial enlargement Rightward axis Pulmonary disease pattern Biventricular hypertrophy Nonspecific ST and T wave abnormality Abnormal ECG Nonspecific ST changes V3 new from prior Confirmed by 04-26-1985 (Alvira Monday) on 04/05/2020 5:12:10 PM   Radiology DG Chest 2 View  Result Date:  04/05/2020 CLINICAL DATA:  49 year old female with chest pain. EXAM: CHEST - 2 VIEW COMPARISON:  Chest radiograph dated 02/21/2017. FINDINGS: There is background of emphysema. Postsurgical changes of the right upper lobe with surgical suture. There is a large area of pneumothorax at the right lung base. No focal consolidation or pleural effusion. The cardiac silhouette is within limits. No acute osseous pathology. Breast implant noted. IMPRESSION: Large right base pneumothorax. These results were called by telephone at the time of interpretation on 04/05/2020 at 4:27 pm to Dr. 13/12/2019, who verbally acknowledged these results. Electronically Signed   By: Jacqulyn Bath M.D.   On: 04/05/2020 16:30    Procedures .Critical Care Performed by: 13/12/2019  S, PA Authorized by: Gailen Shelter, PA   Critical care provider statement:    Critical care time (minutes):  45   Critical care was necessary to treat or prevent imminent or life-threatening deterioration of the following conditions: Pneumothorax.   Critical care was time spent personally by me on the following activities:  Discussions with consultants, evaluation of patient's response to treatment, examination of patient, ordering and performing treatments and interventions, ordering and review of laboratory studies, ordering and review of radiographic studies, pulse oximetry, re-evaluation of patient's condition, obtaining history from patient or surrogate and review of old charts   (including critical care time)  Medications Ordered in ED Medications - No data to display  ED Course  I have reviewed the triage vital signs and the nursing notes.  Pertinent labs & imaging results that were available during my care of the patient were reviewed by me and considered in my medical decision making (see chart for details).    MDM Rules/Calculators/A&P                           Patient with recurrent pneumothorax she is 49 year old female who has  had pneumothorax 2 years ago had VATS procedure done by Dr. Kerin Perna, MD which I complete resolution of the pneumothorax.  She had some mild shortness of breath with some congestion over the past week but seems that her symptoms significantly worsened over the weekend and became short of breath at rest.  On my examination she is very mildly tachypneic with rate of twenty-two SpO2 97-100% on room air.  She is in no acute distress.  Is still uncomfortable.  Is ventilating and seems to be perfusing well.  She has no tracheal deviation.  Physical exam is notable for absent lung sounds in the right base.  She has good apical lung sounds bilaterally.  X-ray reviewed by myself agree of radiology read that there is inferior pneumothorax of the right side.  Discussed with Dr. Renaldo Fiddler of cardiothoracic surgery who is recommends discussing case with critical care and admitted to hospital either via critical care or hospitalist at the critical care discretion.  States patient may either receive a pigtail catheter chest tube today or tomorrow morning.  CT surgery will see patient in consult during hospital stay.  Troponin within normal limits x1, i-STAT ECG pending.  Covid test pending.  BMP without any electrolyte abnormality.  EKG without any acute changes.  Patient care transferred to PA [place] at 7:30 PM she will follow up with critical care for further recommendations.  I discussed this case with my attending physician Dr. Dalene Seltzer as well prior to handoff.   Final Clinical Impression(s) / ED Diagnoses Final diagnoses:  Other pneumothorax    Rx / DC Orders ED Discharge Orders    None       Gailen Shelter, [place] 04/05/20 1952    Alvira Monday, MD 04/05/20 2203

## 2020-04-05 NOTE — ED Notes (Signed)
Pt fed per diet order. 

## 2020-04-05 NOTE — ED Notes (Signed)
[place], husband, [phone] would like number in the chart.

## 2020-04-05 NOTE — Consult Note (Signed)
NAME:  Christine Guerrero, MRN:  [id number], DOB:  [birth year], LOS: 0 ADMISSION DATE:  04/05/2020, CONSULTATION DATE:  11/8 REFERRING MD:  Dr Julian Reil TRH, CHIEF COMPLAINT:  PTX   Brief History   49 year old female with history of spontaneous pneumothorax on the R in 2018 requiring VATs and pleurodesis now presenting with recurrent pneumothorax on R likely triggered by URI/coughing.   History of present illness   49 year old female with PMH as below which is significant for childhood asthma, angioedema, and chronic bronchitis.  Most notably she suffered a spontaneous pneumothorax in 2018 which did not resolve with chest tube and ultimately required VATS pleurodesis by Dr. Donata Clay.  Course has been relatively uncomplicated since that time until approximately 3 weeks prior to arrival when she developed upper respiratory type symptoms including postnasal drip and cough.  Shortly after that she began to experience shortness of breath and right-sided chest pressure.  The symptoms were progressive up until presentation to the emergency department on 11/8.  Imaging confirmed recurrent right-sided pneumothorax.  Thoracic surgery team was involved and recommended chest tube placement that they were the following day with critical care involvement.  She was admitted to the hospitalist service and critical care medicine was consulted.  Past Medical History   has a past medical history of Angio-edema, Childhood asthma, Chondromalacia of both patellae, Chronic bronchitis (HCC), Heart murmur, High cholesterol, Interstitial cystitis, Migraine, and Urticaria.   Significant Hospital Events   11/8 admit  Consults:  11/8 pulmonary critical care 11/8 cardiothoracic surgery  Procedures:    Significant Diagnostic Tests:  CT chest 11/8: Large primarily subpulmonic right pneumothorax.  Volume estimated at approximately 50%.  Minimal mediastinal shift to the left.  Postsurgical changes of the right apex and  multiple pleural adhesions in the right pleural space.  Micro Data:    Antimicrobials:     Interim history/subjective:    Objective   Blood pressure (!) 173/96, pulse (!) 55, temperature 98.3 F (36.8 C), temperature source Oral, resp. rate (!) 24, height 5\' 6"  (1.676 m), weight 62.6 kg, SpO2 98 %.       No intake or output data in the 24 hours ending 04/05/20 2217 Filed Weights   04/05/20 1544  Weight: 62.6 kg    Examination: General: Thin middle-aged female in no acute distress HENT: Normocephalic, atraumatic, PERRL Lungs: Diminished right base, otherwise clear.  In no distress.  O2 sats 99% on 3 L. Cardiovascular: RRR, no MRG, no edema Abdomen: Soft, nontender, nondistended Extremities: No acute deformity or range of motion rotation Neuro: Alert, oriented, nonfocal   Resolved Hospital Problem list     Assessment & Plan:   Right-sided pneumothorax: With history of same.  Status post VATS pleurodesis in 2018 2019 [place]). -She will need chest tube placed -I offered chest tube placement.  She has requested that the thoracic surgeons be involved and evaluate her prior to any procedures.  I relayed this to the admitting team.  Dr. Robinsonborough original recommendation to the emergency department staff would indicate that this is a reasonable approach. -I have asked that she notify the medical team immediately if her symptoms should change.    Best practice:  Diet: per primary. Recommend NPO after midnight.  Pain/Anxiety/Delirium protocol (if indicated): NA VAP protocol (if indicated): NA DVT prophylaxis: per primary GI prophylaxis: per primary Glucose control:per primary  Mobility: per primary Code Status: per primary Family Communication: per primary Disposition: per primary  Labs   CBC:  Recent Labs  Lab 04/05/20 1546  WBC 5.4  HGB 13.0  HCT 40.1  MCV 90.9  PLT 241    Basic Metabolic Panel: Recent Labs  Lab 04/05/20 1546  NA 139  K 3.5  CL 104  CO2 25    GLUCOSE 83  BUN 14  CREATININE 0.75  CALCIUM 9.0   GFR: Estimated Creatinine Clearance: 79.6 mL/min (by C-G formula based on SCr of 0.75 mg/dL). Recent Labs  Lab 04/05/20 1546  WBC 5.4    Liver Function Tests: No results for input(s): AST, ALT, ALKPHOS, BILITOT, PROT, ALBUMIN in the last 168 hours. No results for input(s): LIPASE, AMYLASE in the last 168 hours. No results for input(s): AMMONIA in the last 168 hours.  ABG    Component Value Date/Time   PHART 7.424 01/23/2017 0407   PCO2ART 38.6 01/23/2017 0407   PO2ART 125.0 (H) 01/23/2017 0407   HCO3 25.3 01/23/2017 0407   TCO2 26 01/23/2017 0407   ACIDBASEDEF 1.8 01/21/2017 1330   O2SAT 99.0 01/23/2017 0407     Coagulation Profile: No results for input(s): INR, PROTIME in the last 168 hours.  Cardiac Enzymes: No results for input(s): CKTOTAL, CKMB, CKMBINDEX, TROPONINI in the last 168 hours.  HbA1C: No results found for: HGBA1C  CBG: No results for input(s): GLUCAP in the last 168 hours.  Review of Systems:   Bolds are positive  Constitutional: weight loss, gain, night sweats, Fevers, chills, fatigue .  HEENT: headaches, Sore throat, sneezing, nasal congestion, post nasal drip, Difficulty swallowing, Tooth/dental problems, visual complaints visual changes, ear ache CV:  chest pain, radiates:,Orthopnea, PND, swelling in lower extremities, dizziness, palpitations, syncope.  GI  heartburn, indigestion, abdominal pain, nausea, vomiting, diarrhea, change in bowel habits, loss of appetite, bloody stools.  Resp: cough, productive:, hemoptysis, dyspnea, chest pain, pleuritic.  Skin: rash or itching or icterus GU: dysuria, change in color of urine, urgency or frequency. flank pain, hematuria  MS: joint pain or swelling. decreased range of motion  Psych: change in mood or affect. depression or anxiety.  Neuro: difficulty with speech, weakness, numbness, ataxia    Past Medical History  She,  has a past medical history  of Angio-edema, Childhood asthma, Chondromalacia of both patellae, Chronic bronchitis (HCC), Heart murmur, High cholesterol, Interstitial cystitis, Migraine, and Urticaria.   Surgical History    Past Surgical History:  Procedure Laterality Date  . ABDOMINAL HYSTERECTOMY  2001  . APPENDECTOMY    . AUGMENTATION MAMMAPLASTY Bilateral   . CESAREAN SECTION  1999  . CHEST TUBE INSERTION Right 01/16/2017  . NASAL SEPTUM SURGERY  2012  . SINOSCOPY    . TALC PLEURODESIS Right 01/22/2017   Procedure: MECHANICAL AND TALC PLEURADESIS;  Surgeon: Kerin Perna, MD;  Location: [facility] OR;  Service: Thoracic;  Laterality: Right;  . TONSILLECTOMY    . VIDEO ASSISTED THORACOSCOPY Right 01/22/2017   Procedure: VIDEO ASSISTED THORACOSCOPY Stapling of blebs, right upper lobe wedge resection;  Surgeon: Kerin Perna, MD;  Location: [facility] OR;  Service: Thoracic;  Laterality: Right;     Social History   reports that she has never smoked. She has never used smokeless tobacco. She reports that she does not drink alcohol and does not use drugs.   Family History   Her family history includes Allergies in her father; Asthma in her father; Crohn's disease in her maternal grandfather; Diabetes in her father and paternal grandfather; Heart disease in her father; High Cholesterol in her mother; High blood pressure in  her father and mother; Lung cancer in her father and paternal grandfather; Melanoma in her mother.   Allergies Allergies  Allergen Reactions  . Codeine Nausea And Vomiting     Home Medications  Prior to Admission medications   Medication Sig Start Date End Date Taking? Authorizing Provider  diclofenac (CATAFLAM) 50 MG tablet Take 50 mg by mouth daily.    Yes [provider]  estradiol (ESTRACE) 1 MG tablet Take 1 tablet by mouth daily.   Yes [provider]  ezetimibe (ZETIA) 10 MG tablet Take 10 mg by mouth every evening.   Yes [provider]  fexofenadine (ALLEGRA) 180 MG  tablet Take 1 tablet (180 mg total) by mouth daily. 02/08/16  Yes Padgett, Pilar Grammes, MD  LORazepam (ATIVAN) 0.5 MG tablet Take 0.5 mg by mouth daily as needed for anxiety. 01/15/20  Yes [provider]  omega-3 acid ethyl esters (LOVAZA) 1 g capsule Take 1 g by mouth 2 (two) times daily.   Yes [provider]  omeprazole (PRILOSEC) 20 MG capsule Take 20 mg by mouth in the morning. 03/17/20  Yes [provider]  sertraline (ZOLOFT) 25 MG tablet Take 25 mg by mouth daily. 01/28/20  Yes [provider]      Joneen Roach, AGACNP-BC [facility]  See Amion for personal pager PCCM on call pager [phone]  04/05/2020 10:47 PM

## 2020-04-05 NOTE — H&P (Addendum)
History and Physical    Christine Guerrero WCH:[id number] DOB: Feb 14, 1971 DOA: 04/05/2020  PCP: Bernita Raisin., MD  Patient coming from: Home  I have personally briefly reviewed patient's old medical records in [facility]  Chief Complaint: SOB  HPI: Christine Guerrero is a 49 y.o. female with medical history significant of prior PTx on R, VATS and talc pleurodesis in 2018.  Pt with h/o chronic bronchitis, childhood asthma, non-smoker, no known A1AT.  Pt with 2 week h/o SOB.  Initially though this was congestion and allergic rhinitis, SOB worsened and became severe over weekend.  She presented to UC as a result.  Work up at [facility] showed PTX of R side again.  Pt sent in to ED.  SOB associated with some associated R side pain, not pleuritic.  No cough, no hemoptysis.  Does have mild CP.   ED Course: CT chest shows partial PTx of R side with ~50% collapse.  EDP spoke with CVTS, they made decision to hold off on chest tube placement for the moment and have IR do it in AM due to risk of hitting remaining lung.   Review of Systems: As per HPI, otherwise all review of systems negative.  Past Medical History:  Diagnosis Date  . Angio-edema   . Childhood asthma   . Chondromalacia of both patellae   . Chronic bronchitis (HCC)    "usually q yr; haven't had it in the last 2 yrs" (01/16/2017)  . Heart murmur   . High cholesterol   . Interstitial cystitis   . Migraine    "stopped in 2015" (01/16/2017)  . Urticaria     Past Surgical History:  Procedure Laterality Date  . ABDOMINAL HYSTERECTOMY  2001  . APPENDECTOMY    . AUGMENTATION MAMMAPLASTY Bilateral   . CESAREAN SECTION  1999  . CHEST TUBE INSERTION Right 01/16/2017  . NASAL SEPTUM SURGERY  2012  . SINOSCOPY    . TALC PLEURODESIS Right 01/22/2017   Procedure: MECHANICAL AND TALC PLEURADESIS;  Surgeon: Kerin Perna, MD;  Location: [facility] OR;  Service: Thoracic;  Laterality: Right;  . TONSILLECTOMY    . VIDEO ASSISTED  THORACOSCOPY Right 01/22/2017   Procedure: VIDEO ASSISTED THORACOSCOPY Stapling of blebs, right upper lobe wedge resection;  Surgeon: Kerin Perna, MD;  Location: [facility] OR;  Service: Thoracic;  Laterality: Right;     reports that she has never smoked. She has never used smokeless tobacco. She reports that she does not drink alcohol and does not use drugs.  Allergies  Allergen Reactions  . Codeine Nausea And Vomiting    Family History  Problem Relation Age of Onset  . Melanoma Mother   . High blood pressure Mother   . High Cholesterol Mother   . Asthma Father   . Lung cancer Father   . Heart disease Father   . High blood pressure Father   . Diabetes Father   . Allergies Father        Seasonal  . Crohn's disease Maternal Grandfather   . Lung cancer Paternal Grandfather   . Diabetes Paternal Grandfather      Prior to Admission medications   Medication Sig Start Date End Date Taking? Authorizing Provider  aspirin EC 81 MG EC tablet Take 1 tablet (81 mg total) by mouth daily. 01/25/17  Yes Gold, Wayne E, PA-C  diclofenac (CATAFLAM) 50 MG tablet Take 50 mg by mouth daily.    Yes [provider]  estradiol (ESTRACE) 1 MG  tablet Take 1 tablet by mouth daily.   Yes [provider]  ezetimibe (ZETIA) 10 MG tablet Take 10 mg by mouth every evening.   Yes [provider]  fexofenadine (ALLEGRA) 180 MG tablet Take 1 tablet (180 mg total) by mouth daily. 02/08/16  Yes Padgett, Pilar GrammesShaylar Patricia, MD  LORazepam (ATIVAN) 0.5 MG tablet Take 0.5 mg by mouth daily as needed for anxiety. 01/15/20  Yes [provider]  omega-3 acid ethyl esters (LOVAZA) 1 g capsule Take 1 g by mouth 2 (two) times daily.   Yes [provider]  omeprazole (PRILOSEC) 20 MG capsule Take 20 mg by mouth in the morning. 03/17/20  Yes [provider]  sertraline (ZOLOFT) 25 MG tablet Take 25 mg by mouth daily. 01/28/20  Yes [provider]    Physical  Exam: Vitals:   04/05/20 1705 04/05/20 1900 04/05/20 2000 04/05/20 2100  BP: (!) 185/96 (!) 165/107 (!) 144/94 (!) 173/96  Pulse: 74 (!) 58 (!) 58 (!) 55  Resp: 20 (!) 24 (!) 28 (!) 24  Temp:      TempSrc:      SpO2: 98% 97% 97% 98%  Weight:      Height:        Constitutional: NAD, calm, comfortable Eyes: PERRL, lids and conjunctivae normal ENMT: Mucous membranes are moist. Posterior pharynx clear of any exudate or lesions.Normal dentition.  Neck: normal, supple, no masses, no thyromegaly Respiratory: clear to auscultation bilaterally, no wheezing, no crackles. Normal respiratory effort. No accessory muscle use.  Cardiovascular: Regular rate and rhythm, no murmurs / rubs / gallops. No extremity edema. 2+ pedal pulses. No carotid bruits.  Abdomen: no tenderness, no masses palpated. No hepatosplenomegaly. Bowel sounds positive.  Musculoskeletal: no clubbing / cyanosis. No joint deformity upper and lower extremities. Good ROM, no contractures. Normal muscle tone.  Skin: no rashes, lesions, ulcers. No induration Neurologic: CN 2-12 grossly intact. Sensation intact, DTR normal. Strength 5/5 in all 4.  Psychiatric: Normal judgment and insight. Alert and oriented x 3. Normal mood.    Labs on Admission: I have personally reviewed following labs and imaging studies  CBC: Recent Labs  Lab 04/05/20 1546  WBC 5.4  HGB 13.0  HCT 40.1  MCV 90.9  PLT 241   Basic Metabolic Panel: Recent Labs  Lab 04/05/20 1546  NA 139  K 3.5  CL 104  CO2 25  GLUCOSE 83  BUN 14  CREATININE 0.75  CALCIUM 9.0   GFR: Estimated Creatinine Clearance: 79.6 mL/min (by C-G formula based on SCr of 0.75 mg/dL). Liver Function Tests: No results for input(s): AST, ALT, ALKPHOS, BILITOT, PROT, ALBUMIN in the last 168 hours. No results for input(s): LIPASE, AMYLASE in the last 168 hours. No results for input(s): AMMONIA in the last 168 hours. Coagulation Profile: No results for input(s): INR, PROTIME in  the last 168 hours. Cardiac Enzymes: No results for input(s): CKTOTAL, CKMB, CKMBINDEX, TROPONINI in the last 168 hours. BNP (last 3 results) No results for input(s): PROBNP in the last 8760 hours. HbA1C: No results for input(s): HGBA1C in the last 72 hours. CBG: No results for input(s): GLUCAP in the last 168 hours. Lipid Profile: No results for input(s): CHOL, HDL, LDLCALC, TRIG, CHOLHDL, LDLDIRECT in the last 72 hours. Thyroid Function Tests: No results for input(s): TSH, T4TOTAL, FREET4, T3FREE, THYROIDAB in the last 72 hours. Anemia Panel: No results for input(s): VITAMINB12, FOLATE, FERRITIN, TIBC, IRON, RETICCTPCT in the last 72 hours. Urine analysis:  Component Value Date/Time   COLORURINE YELLOW 01/21/2017 1749   APPEARANCEUR CLEAR 01/21/2017 1749   LABSPEC 1.017 01/21/2017 1749   PHURINE 5.0 01/21/2017 1749   GLUCOSEU NEGATIVE 01/21/2017 1749   HGBUR NEGATIVE 01/21/2017 1749   BILIRUBINUR NEGATIVE 01/21/2017 1749   KETONESUR NEGATIVE 01/21/2017 1749   PROTEINUR NEGATIVE 01/21/2017 1749   NITRITE NEGATIVE 01/21/2017 1749   LEUKOCYTESUR NEGATIVE 01/21/2017 1749    Radiological Exams on Admission: DG Chest 2 View  Result Date: 04/05/2020 CLINICAL DATA:  49 year old female with chest pain. EXAM: CHEST - 2 VIEW COMPARISON:  Chest radiograph dated 02/21/2017. FINDINGS: There is background of emphysema. Postsurgical changes of the right upper lobe with surgical suture. There is a large area of pneumothorax at the right lung base. No focal consolidation or pleural effusion. The cardiac silhouette is within limits. No acute osseous pathology. Breast implant noted. IMPRESSION: Large right base pneumothorax. These results were called by telephone at the time of interpretation on 04/05/2020 at 4:27 pm to Dr. Jacqulyn Bath, who verbally acknowledged these results. Electronically Signed   By: Elgie Collard M.D.   On: 04/05/2020 16:30   CT Chest Wo Contrast  Result Date:  04/05/2020 CLINICAL DATA:  Short of breath, right pneumothorax EXAM: CT CHEST WITHOUT CONTRAST TECHNIQUE: Multidetector CT imaging of the chest was performed following the standard protocol without IV contrast. COMPARISON:  04/05/2020, 09/21/2016 FINDINGS: Cardiovascular: Unenhanced imaging of the heart demonstrates trace pericardial fluid. Normal caliber of the thoracic aorta. No significant atherosclerosis. Mediastinum/Nodes: No enlarged mediastinal or axillary lymph nodes. Thyroid gland, trachea, and esophagus demonstrate no significant findings. Lungs/Pleura: There is a large right pneumothorax, primarily subpulmonic in location. Numerous pleural adhesions are seen throughout the right hemithorax. Volume of pneumothorax estimated 50%. There is mild mediastinal shift to the left. Postsurgical changes are seen at the right apex. Compressive atelectasis is seen within the right lower lobe. No right pleural effusion. The left lung is clear.  Central airways are patent. Upper Abdomen: No acute abnormality. Musculoskeletal: No acute or destructive bony lesions. Reconstructed images demonstrate no additional findings. IMPRESSION: 1. Large primarily sub pulmonic right pneumothorax, volume estimated 50%. Minimal mediastinal shift to the left. 2. Postsurgical changes at the right apex. Multiple pleural adhesions in the right pleural space likely reflect previous attempted pleurodesis. Electronically Signed   By: Sharlet Salina M.D.   On: 04/05/2020 20:33    EKG: Independently reviewed.  Assessment/Plan Principal Problem:   Pneumothorax on right    1. PTx on right - 1. EDP spoke with CVTS: they made decision to hold off on CT placement for the moment and have IR perform in AM. 1. They do not feel there is any concern for tension PTx at the moment. 2. IR consult in AM for CT placement 3. Tele monitor 4. CVTS eval in AM 5. Morphine PRN pain  DVT prophylaxis: SCDs Code Status: Full Family Communication: No  family in room Disposition Plan: Home after PTx resolved and evaluation and treatment by CVTS Consults called: EDP spoke with Dr. Renaldo Fiddler Admission status: Admit to inpatient  Severity of Illness: The appropriate patient status for this patient is INPATIENT. Inpatient status is judged to be reasonable and necessary in order to provide the required intensity of service to ensure the patient's safety. The patient's presenting symptoms, physical exam findings, and initial radiographic and laboratory data in the context of their chronic comorbidities is felt to place them at high risk for further clinical deterioration. Furthermore, it is not anticipated that  the patient will be medically stable for discharge from the hospital within 2 midnights of admission. The following factors support the patient status of inpatient.   IP status for recurrent PTX, need for chest tube (at a minimum), possible need for further management / surgery by CVTS.   * I certify that at the point of admission it is my clinical judgment that the patient will require inpatient hospital care spanning beyond 2 midnights from the point of admission due to high intensity of service, high risk for further deterioration and high frequency of surveillance required.*    Collen Hostler M. DO [facility]  How to contact the [facility] Attending or Consulting provider 7A - 7P or covering provider during after hours 7P -7A, for this patient?  1. Check the care team in [facility] and look for a) attending/consulting TRH provider listed and b) the [facility] team listed 2. Log into [email or web address]  Amion Physician Scheduling and messaging for groups and whole hospitals  On call and physician scheduling software for group practices, residents, hospitalists and other medical providers for call, clinic, rotation and shift schedules. OnCall Enterprise is a hospital-wide system for scheduling doctors and paging doctors on call. EasyPlot is for scientific  plotting and data analysis.  [email or web address]  and use [place]'s universal password to access. If you do not have the password, please contact the hospital operator.  3. Locate the [facility] provider you are looking for under [facility] and page to a number that you can be directly reached. 4. If you still have difficulty reaching the provider, please page the [facility] (Director on Call) for the Hospitalists listed on amion for assistance.  04/05/2020, 9:56 PM

## 2020-04-05 NOTE — ED Notes (Signed)
Warm blankets given.

## 2020-04-05 NOTE — ED Provider Notes (Signed)
Attending Dr. Dalene Seltzer assumed care of patient from [place], [place]. I did not participate in patients care   Linwood Dibbles, PA-C 04/05/20 2008    Alvira Monday, MD 04/06/20 [phone]

## 2020-04-05 NOTE — ED Triage Notes (Signed)
Pt sent here from UC for right side collapse lung and increase SOB.

## 2020-04-06 ENCOUNTER — Inpatient Hospital Stay (HOSPITAL_COMMUNITY): Payer: BC Managed Care – PPO

## 2020-04-06 DIAGNOSIS — J939 Pneumothorax, unspecified: Secondary | ICD-10-CM | POA: Diagnosis not present

## 2020-04-06 DIAGNOSIS — J9311 Primary spontaneous pneumothorax: Secondary | ICD-10-CM

## 2020-04-06 LAB — PROTIME-INR
INR: 1 (ref 0.8–1.2)
Prothrombin Time: 12.9 seconds (ref 11.4–15.2)

## 2020-04-06 MED ORDER — FENTANYL CITRATE (PF) 100 MCG/2ML IJ SOLN
INTRAMUSCULAR | Status: AC
  Filled 2020-04-06: qty 2

## 2020-04-06 MED ORDER — MIDAZOLAM HCL 2 MG/2ML IJ SOLN
INTRAMUSCULAR | Status: AC
  Filled 2020-04-06: qty 2

## 2020-04-06 MED ORDER — FENTANYL CITRATE (PF) 100 MCG/2ML IJ SOLN
INTRAMUSCULAR | Status: AC | PRN
Start: 2020-04-06 — End: 2020-04-06
  Administered 2020-04-06 (×2): 50 ug via INTRAVENOUS

## 2020-04-06 MED ORDER — KETOROLAC TROMETHAMINE 15 MG/ML IJ SOLN
15.0000 mg | Freq: Four times a day (QID) | INTRAMUSCULAR | Status: AC | PRN
  Administered 2020-04-06 – 2020-04-11 (×8): 15 mg via INTRAVENOUS
  Filled 2020-04-06 (×8): qty 1

## 2020-04-06 MED ORDER — HYDROCOD POLST-CPM POLST ER 10-8 MG/5ML PO SUER: 5.0000 mL | Freq: Two times a day (BID) | ORAL | Status: DC | PRN

## 2020-04-06 MED ORDER — MIDAZOLAM HCL 2 MG/2ML IJ SOLN
INTRAMUSCULAR | Status: AC | PRN
  Administered 2020-04-06 (×2): 1 mg via INTRAVENOUS

## 2020-04-06 MED ORDER — DICLOFENAC SODIUM 75 MG PO TBEC
75.0000 mg | DELAYED_RELEASE_TABLET | Freq: Every day | ORAL | Status: DC
  Administered 2020-04-06 – 2020-04-17 (×12): 75 mg via ORAL
  Filled 2020-04-06 (×12): qty 1

## 2020-04-06 NOTE — ED Notes (Signed)
Pt transported to IR 

## 2020-04-06 NOTE — Progress Notes (Signed)
Progress Note    Christine Guerrero  STM:[id number] DOB: January 28, 1971  DOA: 04/05/2020 PCP: Bernita Raisin., MD    Brief Narrative:     Medical records reviewed and are as summarized below:  Christine Guerrero is an 49 y.o. female with medical history significant of prior PTx on R, VATS and talc pleurodesis in 2018. Pt with h/o chronic bronchitis, childhood asthma, non-smoker, no known A1AT. Found to have another 50% right PTx  Assessment/Plan:   Principal Problem:   Pneumothorax on right   PTx on right - 1. IR to place chest tube in hopes of right lung re-expansion w/o additional surgery 2. Pain control   Anxiety/depression -ativan -zoloft    Family Communication/Anticipated D/C date and plan/Code Status   DVT prophylaxis: scd Code Status: Full Code.  Disposition Plan: Status is: Inpatient  Remains inpatient appropriate because:Inpatient level of care appropriate due to severity of illness   Dispo: The patient is from: Home              Anticipated d/c is to: Home              Anticipated d/c date is: 3 days              Patient currently is not medically stable to d/c.         Medical Consultants:    PCCM  IR  CVTS.     Subjective:   No worsening pain or SOB  Objective:    Vitals:   04/06/20 1100 04/06/20 1130 04/06/20 1200 04/06/20 1230  BP: 123/69 103/67 113/81 (!) 134/97  Pulse: (!) 54 (!) 55 (!) 56 64  Resp: 17 14 18 13   Temp:      TempSrc:      SpO2: 97% 96% 97% 96%  Weight:      Height:        Intake/Output Summary (Last 24 hours) at 04/06/2020 1314 Last data filed at 04/06/2020 0919 Gross per 24 hour  Intake --  Output 600 ml  Net -600 ml   Filed Weights   04/05/20 1544  Weight: 62.6 kg    Exam  General: Appearance:    Well developed, well nourished female in no acute distress     Lungs:      respirations unlabored, diminished on right side  Heart:    Bradycardic. Normal rhythm. No murmurs, rubs, or  gallops.   MS:   All extremities are intact.   Neurologic:   Awake, alert, oriented x 3. No apparent focal neurological           defect.     Data Reviewed:   I have personally reviewed following labs and imaging studies:  Labs: Labs show the following:   Basic Metabolic Panel: Recent Labs  Lab 04/05/20 1546  NA 139  K 3.5  CL 104  CO2 25  GLUCOSE 83  BUN 14  CREATININE 0.75  CALCIUM 9.0   GFR Estimated Creatinine Clearance: 79.6 mL/min (by C-G formula based on SCr of 0.75 mg/dL). Liver Function Tests: No results for input(s): AST, ALT, ALKPHOS, BILITOT, PROT, ALBUMIN in the last 168 hours. No results for input(s): LIPASE, AMYLASE in the last 168 hours. No results for input(s): AMMONIA in the last 168 hours. Coagulation profile No results for input(s): INR, PROTIME in the last 168 hours.  CBC: Recent Labs  Lab 04/05/20 1546  WBC 5.4  HGB 13.0  HCT 40.1  MCV 90.9  PLT 241  Cardiac Enzymes: No results for input(s): CKTOTAL, CKMB, CKMBINDEX, TROPONINI in the last 168 hours. BNP (last 3 results) No results for input(s): PROBNP in the last 8760 hours. CBG: No results for input(s): GLUCAP in the last 168 hours. D-Dimer: No results for input(s): DDIMER in the last 72 hours. Hgb A1c: No results for input(s): HGBA1C in the last 72 hours. Lipid Profile: No results for input(s): CHOL, HDL, LDLCALC, TRIG, CHOLHDL, LDLDIRECT in the last 72 hours. Thyroid function studies: No results for input(s): TSH, T4TOTAL, T3FREE, THYROIDAB in the last 72 hours.  Invalid input(s): FREET3 Anemia work up: No results for input(s): VITAMINB12, FOLATE, FERRITIN, TIBC, IRON, RETICCTPCT in the last 72 hours. Sepsis Labs: Recent Labs  Lab 04/05/20 1546  WBC 5.4    Microbiology Recent Results (from the past 240 hour(s))  Respiratory Panel by RT PCR (Flu A&B, Covid) - Nasopharyngeal Swab     Status: None   Collection Time: 04/05/20  7:00 PM   Specimen: Nasopharyngeal Swab   Result Value Ref Range Status   SARS Coronavirus 2 by RT PCR NEGATIVE NEGATIVE Final    Comment: (NOTE) SARS-CoV-2 target nucleic acids are NOT DETECTED.  The SARS-CoV-2 RNA is generally detectable in upper respiratoy specimens during the acute phase of infection. The lowest concentration of SARS-CoV-2 viral copies this assay can detect is 131 copies/mL. A negative result does not preclude SARS-Cov-2 infection and should not be used as the sole basis for treatment or other patient management decisions. A negative result may occur with  improper specimen collection/handling, submission of specimen other than nasopharyngeal swab, presence of viral mutation(s) within the areas targeted by this assay, and inadequate number of viral copies (<131 copies/mL). A negative result must be combined with clinical observations, patient history, and epidemiological information. The expected result is Negative.  Fact Sheet for Patients:  https://www.moore.com/  Fact Sheet for Healthcare Providers:  https://www.young.biz/  This test is no t yet approved or cleared by the [place] FDA and  has been authorized for detection and/or diagnosis of SARS-CoV-2 by FDA under an Emergency Use Authorization (EUA). This EUA will remain  in effect (meaning this test can be used) for the duration of the COVID-19 declaration under Section 564(b)(1) of the Act, 21 U.S.C. section 360bbb-3(b)(1), unless the authorization is terminated or revoked sooner.     Influenza A by PCR NEGATIVE NEGATIVE Final   Influenza B by PCR NEGATIVE NEGATIVE Final    Comment: (NOTE) The Xpert Xpress SARS-CoV-2/FLU/RSV assay is intended as an aid in  the diagnosis of influenza from Nasopharyngeal swab specimens and  should not be used as a sole basis for treatment. Nasal washings and  aspirates are unacceptable for Xpert Xpress SARS-CoV-2/FLU/RSV  testing.  Fact Sheet for  Patients: https://www.moore.com/  Fact Sheet for Healthcare Providers: https://www.young.biz/  This test is not yet approved or cleared by the [place] FDA and  has been authorized for detection and/or diagnosis of SARS-CoV-2 by  FDA under an Emergency Use Authorization (EUA). This EUA will remain  in effect (meaning this test can be used) for the duration of the  Covid-19 declaration under Section 564(b)(1) of the Act, 21  U.S.C. section 360bbb-3(b)(1), unless the authorization is  terminated or revoked. Performed at [facility], [address] [place], [place] [address]     Procedures and diagnostic studies:  DG Chest 2 View  Result Date: 04/05/2020 CLINICAL DATA:  49 year old female with chest pain. EXAM: CHEST - 2 VIEW COMPARISON:  Chest radiograph dated 02/21/2017. FINDINGS: There is background of emphysema. Postsurgical changes of the right upper lobe with surgical suture. There is a large area of pneumothorax at the right lung base. No focal consolidation or pleural effusion. The cardiac silhouette is within limits. No acute osseous pathology. Breast implant noted. IMPRESSION: Large right base pneumothorax. These results were called by telephone at the time of interpretation on 04/05/2020 at 4:27 pm to Dr. Jacqulyn Bath, who verbally acknowledged these results. Electronically Signed   By: Elgie Collard M.D.   On: 04/05/2020 16:30   CT Chest Wo Contrast  Result Date: 04/05/2020 CLINICAL DATA:  Short of breath, right pneumothorax EXAM: CT CHEST WITHOUT CONTRAST TECHNIQUE: Multidetector CT imaging of the chest was performed following the standard protocol without IV contrast. COMPARISON:  04/05/2020, 09/21/2016 FINDINGS: Cardiovascular: Unenhanced imaging of the heart demonstrates trace pericardial fluid. Normal caliber of the thoracic aorta. No significant atherosclerosis. Mediastinum/Nodes: No enlarged mediastinal or axillary lymph nodes.  Thyroid gland, trachea, and esophagus demonstrate no significant findings. Lungs/Pleura: There is a large right pneumothorax, primarily subpulmonic in location. Numerous pleural adhesions are seen throughout the right hemithorax. Volume of pneumothorax estimated 50%. There is mild mediastinal shift to the left. Postsurgical changes are seen at the right apex. Compressive atelectasis is seen within the right lower lobe. No right pleural effusion. The left lung is clear.  Central airways are patent. Upper Abdomen: No acute abnormality. Musculoskeletal: No acute or destructive bony lesions. Reconstructed images demonstrate no additional findings. IMPRESSION: 1. Large primarily sub pulmonic right pneumothorax, volume estimated 50%. Minimal mediastinal shift to the left. 2. Postsurgical changes at the right apex. Multiple pleural adhesions in the right pleural space likely reflect previous attempted pleurodesis. Electronically Signed   By: Sharlet Salina M.D.   On: 04/05/2020 20:33    Medications:   . diclofenac  75 mg Oral Q breakfast  . estradiol  1 mg Oral Daily  . ezetimibe  10 mg Oral QPM  . loratadine  10 mg Oral Daily  . omega-3 acid ethyl esters  1 g Oral BID  . pantoprazole  40 mg Oral Daily  . sertraline  25 mg Oral Daily   Continuous Infusions:   LOS: 1 day   Joseph Art  [facility]   How to contact the [facility] Attending or Consulting provider 7A - 7P or covering provider during after hours 7P -7A, for this patient?  1. Check the care team in [facility] and look for a) attending/consulting TRH provider listed and b) the [facility] team listed 2. Log into [email or web address] and use [place]'s universal password to access. If you do not have the password, please contact the hospital operator. 3. Locate the [facility] provider you are looking for under [facility] and page to a number that you can be directly reached. 4. If you still have difficulty reaching the provider, please page the [facility]  (Director on Call) for the Hospitalists listed on amion for assistance.  04/06/2020, 1:14 PM

## 2020-04-06 NOTE — Procedures (Signed)
Interventional Radiology Procedure Note  Procedure: Placement of 5F right sided chest tube.   Complications: None  Estimated Blood Loss: None  Recommendations: - Chest tube to [facility]    Signed,  Sterling Big, MD

## 2020-04-06 NOTE — Plan of Care (Signed)

## 2020-04-06 NOTE — Consult Note (Signed)
[address]       [place] [address]             [phone]        Christine Guerrero [facility] Medical Record #[id number] Date of Birth: 10-27-1970  Referring: ED  Dr Julian Reil Primary Care: Donnie Mesa Margarita Mail., MD Primary Cardiologist:No primary care provider on file.  Chief Complaint:   Right Chest ache, shortness of breath   Chief Complaint  Patient presents with  . Shortness of Breath   Patient examined, images of chest x-ray and CT scan of chest personally reviewed and discussed with patient  History of Present Illness:     49 year old non-smoker presents to the ED with recurrent right chest ache aching discomfort and some shortness of breath following an episode of sinus congestion and coughing.  She is status post right VATS over 3 years ago for right spontaneous pneumothorax.  Chest x-ray and CT scan shows a 40% basilar subpulmonic pneumothorax.  There are several adhesions present from the pleurodesis performed at the time of VATS.  There are no areas of consolidation or densities, no abnormal adenopathy and no pleural effusion.  No blebs are visible on CT scan.  The patient is breathing comfortably on room air.  Arrangements for IR consultation and placement of a CT-guided right subpulmonic pigtail catheter are in progress. Current Activity/ Functional Status: Normal activities somewhat limited by shortness of breath Zubrod Score: At the time of surgery this patient's most appropriate activity status/level should be described as: []     0    Normal activity, no symptoms [x]     1    Restricted in physical strenuous activity but ambulatory, able to do out light work []     2    Ambulatory and capable of self care, unable to do work activities, up and about                 more than 50%  Of the time                            []     3    Only limited self care, in bed greater than 50% of waking hours []     4    Completely disabled, no self care, confined to  bed or chair []     5    Moribund  Past Medical History:  Diagnosis Date  . Angio-edema   . Childhood asthma   . Chondromalacia of both patellae   . Chronic bronchitis (HCC)    "usually q yr; haven't had it in the last 2 yrs" (01/16/2017)  . Heart murmur   . High cholesterol   . Interstitial cystitis   . Migraine    "stopped in 2015" (01/16/2017)  . Urticaria     Past Surgical History:  Procedure Laterality Date  . ABDOMINAL HYSTERECTOMY  2001  . APPENDECTOMY    . AUGMENTATION MAMMAPLASTY Bilateral   . CESAREAN SECTION  1999  . CHEST TUBE INSERTION Right 01/16/2017  . NASAL SEPTUM SURGERY  2012  . SINOSCOPY    . TALC PLEURODESIS Right 01/22/2017   Procedure: MECHANICAL AND TALC PLEURADESIS;  Surgeon: 01/18/2017, MD;  Location: [facility] OR;  Service: Thoracic;  Laterality: Right;  . TONSILLECTOMY    . VIDEO ASSISTED THORACOSCOPY Right 01/22/2017   Procedure: VIDEO ASSISTED THORACOSCOPY Stapling of blebs, right upper lobe wedge resection;  Surgeon:  Kerin PernaVan Trigt, Saverio Kader, MD;  [facility];  Service: Thoracic;  Laterality: Right;    Social History   Tobacco Use  Smoking Status Never Smoker  Smokeless Tobacco Never Used    Social History   Substance and Sexual Activity  Alcohol Use No     Allergies  Allergen Reactions  . Codeine Nausea And Vomiting    Current Facility-Administered Medications  Medication Dose Route Frequency Provider Last Rate Last Admin  . acetaminophen (TYLENOL) tablet 650 mg  650 mg Oral Q6H PRN Hillary BowGardner, Jared M, DO   650 mg at 04/06/20 [id number]   Or  . acetaminophen (TYLENOL) suppository 650 mg  650 mg Rectal Q6H PRN Hillary BowGardner, Jared M, DO      . diclofenac (VOLTAREN) EC tablet 75 mg  75 mg Oral Q breakfast Hillary BowGardner, Jared M, DO   75 mg at 04/06/20 0737  . estradiol (ESTRACE) tablet 1 mg  1 mg Oral Daily Julian ReilGardner, Jared M, DO      . ezetimibe (ZETIA) tablet 10 mg  10 mg Oral QPM Lyda PeroneGardner, Jared M, DO   10 mg at 04/05/20 2240  . loratadine (CLARITIN) tablet  10 mg  10 mg Oral Daily Lyda PeroneGardner, Jared M, DO   10 mg at 04/06/20 [id number]  . LORazepam (ATIVAN) tablet 0.5 mg  0.5 mg Oral Daily PRN Hillary BowGardner, Jared M, DO      . morphine 2 MG/ML injection 2-4 mg  2-4 mg Intravenous Q4H PRN Hillary BowGardner, Jared M, DO      . omega-3 acid ethyl esters (LOVAZA) capsule 1 g  1 g Oral BID Lyda PeroneGardner, Jared M, DO   1 g at 04/06/20 0920  . ondansetron (ZOFRAN) tablet 4 mg  4 mg Oral Q6H PRN Hillary BowGardner, Jared M, DO       Or  . ondansetron [place](ZOFRAN) injection 4 mg  4 mg Intravenous Q6H PRN Hillary BowGardner, Jared M, DO      . pantoprazole (PROTONIX) EC tablet 40 mg  40 mg Oral Daily Lyda PeroneGardner, Jared M, DO   40 mg at 04/06/20 0921  . sertraline (ZOLOFT) tablet 25 mg  25 mg Oral Daily Lyda PeroneGardner, Jared M, DO   25 mg at 04/06/20 [id number]   Current Outpatient Medications  Medication Sig Dispense Refill  . diclofenac (CATAFLAM) 50 MG tablet Take 50 mg by mouth daily.     Marland Kitchen. estradiol (ESTRACE) 1 MG tablet Take 1 tablet by mouth daily.    Marland Kitchen. ezetimibe (ZETIA) 10 MG tablet Take 10 mg by mouth every evening.    . fexofenadine (ALLEGRA) 180 MG tablet Take 1 tablet (180 mg total) by mouth daily. 30 tablet 5  . LORazepam (ATIVAN) 0.5 MG tablet Take 0.5 mg by mouth daily as needed for anxiety.    Marland Kitchen. omega-3 acid ethyl esters (LOVAZA) 1 g capsule Take 1 g by mouth 2 (two) times daily.    Marland Kitchen. omeprazole (PRILOSEC) 20 MG capsule Take 20 mg by mouth in the morning.    . sertraline (ZOLOFT) 25 MG tablet Take 25 mg by mouth daily.      (Not in a hospital admission)   Family History  Problem Relation Age of Onset  . Melanoma Mother   . High blood pressure Mother   . High Cholesterol Mother   . Asthma Father   . Lung cancer Father   . Heart disease Father   . High blood pressure Father   . Diabetes Father   . Allergies Father  Seasonal  . Crohn's disease Maternal Grandfather   . Lung cancer Paternal Grandfather   . Diabetes Paternal Grandfather      Review of Systems:   ROS      Cardiac Review of  Systems: Y or  [    ]= no  Chest Pain [mild]  Resting SOB [   ] Exertional SOB  [yes]  Orthopnea [  ]   Pedal Edema [   ]    Palpitations [  ] Syncope  [  ]   Presyncope [   ]  General Review of Systems: [Y] = yes [  ]=no Constitional: recent weight change [  ]; anorexia [  ]; fatigue [  ]; nausea [  ]; night sweats [  ]; fever [  ]; or chills [  ]                                                               Dental: Last Dentist visit:   Eye : blurred vision [  ]; diplopia [   ]; vision changes [  ];  Amaurosis fugax[  ]; Resp: cough [yes];  wheezing[  ];  hemoptysis[  ]; shortness of breath[  ]; paroxysmal nocturnal dyspnea[  ]; dyspnea on exertion[yes]; or orthopnea[  ];  GI:  gallstones[  ], vomiting[  ];  dysphagia[  ]; melena[  ];  hematochezia [  ]; heartburn[  ];   Hx of  Colonoscopy[  ]; GU: kidney stones [  ]; hematuria[  ];   dysuria [  ];  nocturia[  ];  history of     obstruction [  ]; urinary frequency [  ]             Skin: rash, swelling[  ];, hair loss[  ];  peripheral edema[  ];  or itching[  ]; Musculosketetal: myalgias[  ];  joint swelling[  ];  joint erythema[  ];  joint pain[  ];  back pain[  ];  Heme/Lymph: bruising[  ];  bleeding[  ];  anemia[  ];  Neuro: TIA[  ];  headaches[  ];  stroke[  ];  vertigo[  ];  seizures[  ];   paresthesias[  ];  difficulty walking[  ];  Psych:depression[  ]; anxiety[  ];  Endocrine: diabetes[  ];  thyroid dysfunction[  ];                        Physical Exam: BP 121/88   Pulse (!) 54   Temp 98.4 F (36.9 C) (Oral)   Resp 17   Ht 5\' 6"  (1.676 m)   Wt 62.6 kg   SpO2 99%   BMI 22.28 kg/m        Exam    General- alert and comfortable    Neck- no JVD, no cervical adenopathy palpable, no carotid bruit   Lungs- clear without rales, wheezes, slightly diminished breath sounds at right base.  Well-healed right VATS incisions.   Cor- regular rate and rhythm, no murmur , gallop   Abdomen- soft, non-tender   Extremities - warm,  non-tender, minimal edema   Neuro- oriented, appropriate, no focal weakness    Diagnostic Studies & Laboratory data:     Recent  Radiology Findings:   DG Chest 2 View  Result Date: 04/05/2020 CLINICAL DATA:  49 year old female with chest pain. EXAM: CHEST - 2 VIEW COMPARISON:  Chest radiograph dated 02/21/2017. FINDINGS: There is background of emphysema. Postsurgical changes of the right upper lobe with surgical suture. There is a large area of pneumothorax at the right lung base. No focal consolidation or pleural effusion. The cardiac silhouette is within limits. No acute osseous pathology. Breast implant noted. IMPRESSION: Large right base pneumothorax. These results were called by telephone at the time of interpretation on 04/05/2020 at 4:27 pm to Dr. Jacqulyn Bath, who verbally acknowledged these results. Electronically Signed   By: Elgie Collard M.D.   On: 04/05/2020 16:30   CT Chest Wo Contrast  Result Date: 04/05/2020 CLINICAL DATA:  Short of breath, right pneumothorax EXAM: CT CHEST WITHOUT CONTRAST TECHNIQUE: Multidetector CT imaging of the chest was performed following the standard protocol without IV contrast. COMPARISON:  04/05/2020, 09/21/2016 FINDINGS: Cardiovascular: Unenhanced imaging of the heart demonstrates trace pericardial fluid. Normal caliber of the thoracic aorta. No significant atherosclerosis. Mediastinum/Nodes: No enlarged mediastinal or axillary lymph nodes. Thyroid gland, trachea, and esophagus demonstrate no significant findings. Lungs/Pleura: There is a large right pneumothorax, primarily subpulmonic in location. Numerous pleural adhesions are seen throughout the right hemithorax. Volume of pneumothorax estimated 50%. There is mild mediastinal shift to the left. Postsurgical changes are seen at the right apex. Compressive atelectasis is seen within the right lower lobe. No right pleural effusion. The left lung is clear.  Central airways are patent. Upper Abdomen: No acute  abnormality. Musculoskeletal: No acute or destructive bony lesions. Reconstructed images demonstrate no additional findings. IMPRESSION: 1. Large primarily sub pulmonic right pneumothorax, volume estimated 50%. Minimal mediastinal shift to the left. 2. Postsurgical changes at the right apex. Multiple pleural adhesions in the right pleural space likely reflect previous attempted pleurodesis. Electronically Signed   By: Sharlet Salina M.D.   On: 04/05/2020 20:33     I have independently reviewed the above radiologic studies and discussed with the patient   Recent Lab Findings: Lab Results  Component Value Date   WBC 5.4 04/05/2020   HGB 13.0 04/05/2020   HCT 40.1 04/05/2020   PLT 241 04/05/2020   GLUCOSE 83 04/05/2020   ALT 71 (H) 01/24/2017   AST 57 (H) 01/24/2017   NA 139 04/05/2020   K 3.5 04/05/2020   CL 104 04/05/2020   CREATININE 0.75 04/05/2020   BUN 14 04/05/2020   CO2 25 04/05/2020   INR 1.00 01/21/2017      Assessment / Plan:   CT-guided right basilar pigtail catheter by IR for recurrent right spontaneous pneumothorax Hopefully with a well-placed chest tube and suction the right lung will reexpand and heal without needing additional surgery.  Plan discussed with patient and all questions addressed. We will follow patient.      04/06/2020 9:33 AM

## 2020-04-06 NOTE — Consult Note (Signed)
Chief Complaint: Patient was seen in consultation today for right spontaneous PTX/right chest tube placement.  Referring Physician(s): Hillary Bow [facility])  Supervising Physician: Malachy Moan  Patient Status: [facility] - ED  History of Present Illness: Christine Guerrero is a 49 y.o. female with a past medical history of high cholesterol, migraines, chronic bronchitis, childhood asthma, right spontaneous PTX s/p pleurodesis in 2018, and interstitial cystitis. She presented to [facility] 04/05/2020 with complaints of dyspnea. Work-up revealed spontaneous right PTX and she was referred to [facility] ED for further management. In ED, CT chest revealed partial right PTX with about 50% collapse. She was admitted for further management. TCTS was notified, CCM was consulted who recommended IR consult for possible right chest tube placement.  CT chest 04/05/2020: 1. Large primarily sub pulmonic right pneumothorax, volume estimated 50%. Minimal mediastinal shift to the left. 2. Postsurgical changes at the right apex. Multiple pleural adhesions in the right pleural space likely reflect previous attempted pleurodesis.  IR consulted by Dr. Julian Reil for possible image-guided right chest tube placement. Patient awake and alert sitting in bed. Complains of right-sided chest pain, stable since admission. Complains of dyspnea, stable since admission. Denies fever, chills, abdominal pain, or headache.   Past Medical History:  Diagnosis Date  . Angio-edema   . Childhood asthma   . Chondromalacia of both patellae   . Chronic bronchitis (HCC)    "usually q yr; haven't had it in the last 2 yrs" (01/16/2017)  . Heart murmur   . High cholesterol   . Interstitial cystitis   . Migraine    "stopped in 2015" (01/16/2017)  . Urticaria     Past Surgical History:  Procedure Laterality Date  . ABDOMINAL HYSTERECTOMY  2001  . APPENDECTOMY    . AUGMENTATION MAMMAPLASTY Bilateral   . CESAREAN SECTION  1999  . CHEST TUBE  INSERTION Right 01/16/2017  . NASAL SEPTUM SURGERY  2012  . SINOSCOPY    . TALC PLEURODESIS Right 01/22/2017   Procedure: MECHANICAL AND TALC PLEURADESIS;  Surgeon: Kerin Perna, MD;  Location: [facility] OR;  Service: Thoracic;  Laterality: Right;  . TONSILLECTOMY    . VIDEO ASSISTED THORACOSCOPY Right 01/22/2017   Procedure: VIDEO ASSISTED THORACOSCOPY Stapling of blebs, right upper lobe wedge resection;  Surgeon: Kerin Perna, MD;  Location: [facility] OR;  Service: Thoracic;  Laterality: Right;    Allergies: Codeine  Medications: Prior to Admission medications   Medication Sig Start Date End Date Taking? Authorizing Provider  diclofenac (CATAFLAM) 50 MG tablet Take 50 mg by mouth daily.    Yes [provider]  estradiol (ESTRACE) 1 MG tablet Take 1 tablet by mouth daily.   Yes [provider]  ezetimibe (ZETIA) 10 MG tablet Take 10 mg by mouth every evening.   Yes [provider]  fexofenadine (ALLEGRA) 180 MG tablet Take 1 tablet (180 mg total) by mouth daily. 02/08/16  Yes Padgett, Pilar Grammes, MD  LORazepam (ATIVAN) 0.5 MG tablet Take 0.5 mg by mouth daily as needed for anxiety. 01/15/20  Yes [provider]  omega-3 acid ethyl esters (LOVAZA) 1 g capsule Take 1 g by mouth 2 (two) times daily.   Yes [provider]  omeprazole (PRILOSEC) 20 MG capsule Take 20 mg by mouth in the morning. 03/17/20  Yes [provider]  sertraline (ZOLOFT) 25 MG tablet Take 25 mg by mouth daily. 01/28/20  Yes [provider]     Family History  Problem Relation Age of  Onset  . Melanoma Mother   . High blood pressure Mother   . High Cholesterol Mother   . Asthma Father   . Lung cancer Father   . Heart disease Father   . High blood pressure Father   . Diabetes Father   . Allergies Father        Seasonal  . Crohn's disease Maternal Grandfather   . Lung cancer Paternal Grandfather   . Diabetes Paternal Grandfather     Social History    Socioeconomic History  . Marital status: Married    Spouse name: Not on file  . Number of children: Not on file  . Years of education: Not on file  . Highest education level: Not on file  Occupational History  . Not on file  Tobacco Use  . Smoking status: Never Smoker  . Smokeless tobacco: Never Used  Vaping Use  . Vaping Use: Never used  Substance and Sexual Activity  . Alcohol use: No  . Drug use: No  . Sexual activity: Yes  Other Topics Concern  . Not on file  Social History Narrative  . Not on file   Social Determinants of Health   Financial Resource Strain:   . Difficulty of Paying Living Expenses: Not on file  Food Insecurity:   . Worried About Programme researcher, broadcasting/film/video in the Last Year: Not on file  . Ran Out of Food in the Last Year: Not on file  Transportation Needs:   . Lack of Transportation (Medical): Not on file  . Lack of Transportation (Non-Medical): Not on file  Physical Activity:   . Days of Exercise per Week: Not on file  . Minutes of Exercise per Session: Not on file  Stress:   . Feeling of Stress : Not on file  Social Connections:   . Frequency of Communication with Friends and Family: Not on file  . Frequency of Social Gatherings with Friends and Family: Not on file  . Attends Religious Services: Not on file  . Active Member of Clubs or Organizations: Not on file  . Attends Banker Meetings: Not on file  . Marital Status: Not on file     Review of Systems: A 12 point ROS discussed and pertinent positives are indicated in the HPI above.  All other systems are negative.  Review of Systems  Constitutional: Negative for chills and fever.  Respiratory: Positive for shortness of breath. Negative for wheezing.   Cardiovascular: Positive for chest pain. Negative for palpitations.  Gastrointestinal: Negative for abdominal pain.  Neurological: Negative for headaches.  Psychiatric/Behavioral: Negative for behavioral problems and confusion.     Vital Signs: BP 121/88   Pulse (!) 54   Temp 98.4 F (36.9 C) (Oral)   Resp 17   Ht 5\' 6"  (1.676 m)   Wt 138 lb 0.1 oz (62.6 kg)   SpO2 99%   BMI 22.28 kg/m   Physical Exam Vitals and nursing note reviewed.  Constitutional:      General: She is not in acute distress.    Appearance: Normal appearance.  Cardiovascular:     Rate and Rhythm: Normal rate and regular rhythm.     Heart sounds: Normal heart sounds. No murmur heard.   Pulmonary:     Effort: Pulmonary effort is normal. No respiratory distress.     Breath sounds: No wheezing.     Comments: Decreased breath sounds on the right. Skin:    General: Skin is warm and dry.  Neurological:     Mental Status: She is alert and oriented to person, place, and time.      MD Evaluation Airway: WNL Heart: WNL Abdomen: WNL Chest/ Lungs: WNL ASA  Classification: 2 Mallampati/Airway Score: One   Imaging: DG Chest 2 View  Result Date: 04/05/2020 CLINICAL DATA:  49 year old female with chest pain. EXAM: CHEST - 2 VIEW COMPARISON:  Chest radiograph dated 02/21/2017. FINDINGS: There is background of emphysema. Postsurgical changes of the right upper lobe with surgical suture. There is a large area of pneumothorax at the right lung base. No focal consolidation or pleural effusion. The cardiac silhouette is within limits. No acute osseous pathology. Breast implant noted. IMPRESSION: Large right base pneumothorax. These results were called by telephone at the time of interpretation on 04/05/2020 at 4:27 pm to Dr. Jacqulyn Bath, who verbally acknowledged these results. Electronically Signed   By: Elgie Collard M.D.   On: 04/05/2020 16:30   CT Chest Wo Contrast  Result Date: 04/05/2020 CLINICAL DATA:  Short of breath, right pneumothorax EXAM: CT CHEST WITHOUT CONTRAST TECHNIQUE: Multidetector CT imaging of the chest was performed following the standard protocol without IV contrast. COMPARISON:  04/05/2020, 09/21/2016 FINDINGS:  Cardiovascular: Unenhanced imaging of the heart demonstrates trace pericardial fluid. Normal caliber of the thoracic aorta. No significant atherosclerosis. Mediastinum/Nodes: No enlarged mediastinal or axillary lymph nodes. Thyroid gland, trachea, and esophagus demonstrate no significant findings. Lungs/Pleura: There is a large right pneumothorax, primarily subpulmonic in location. Numerous pleural adhesions are seen throughout the right hemithorax. Volume of pneumothorax estimated 50%. There is mild mediastinal shift to the left. Postsurgical changes are seen at the right apex. Compressive atelectasis is seen within the right lower lobe. No right pleural effusion. The left lung is clear.  Central airways are patent. Upper Abdomen: No acute abnormality. Musculoskeletal: No acute or destructive bony lesions. Reconstructed images demonstrate no additional findings. IMPRESSION: 1. Large primarily sub pulmonic right pneumothorax, volume estimated 50%. Minimal mediastinal shift to the left. 2. Postsurgical changes at the right apex. Multiple pleural adhesions in the right pleural space likely reflect previous attempted pleurodesis. Electronically Signed   By: Sharlet Salina M.D.   On: 04/05/2020 20:33    Labs:  CBC: Recent Labs    04/05/20 1546  WBC 5.4  HGB 13.0  HCT 40.1  PLT 241    COAGS: No results for input(s): INR, APTT in the last 8760 hours.  BMP: Recent Labs    04/05/20 1546  NA 139  K 3.5  CL 104  CO2 25  GLUCOSE 83  BUN 14  CALCIUM 9.0  CREATININE 0.75  GFRNONAA >60     Assessment and Plan:  Recurrent spontaneous right PTX. Plan for image-guided right chest tube placement today in IR. Patient is NPO. Afebrile. COVID negative 04/05/2020.  Risks and benefits discussed with the patient including bleeding, infection, damage to adjacent structures, and sepsis. All of the patient's questions were answered, patient is agreeable to proceed. Consent signed and in IR control  room.   Thank you for this interesting consult.  I greatly enjoyed meeting Christine Guerrero and look forward to participating in their care.  A copy of this report was sent to the requesting provider on this date.  Electronically Signed: Elwin Mocha, PA-C 04/06/2020, 9:03 AM   I spent a total of 40 Minutes in face to face in clinical consultation, greater than 50% of which was counseling/coordinating care for right spontaneous PTX/right chest tube placement.

## 2020-04-07 ENCOUNTER — Inpatient Hospital Stay (HOSPITAL_COMMUNITY): Payer: BC Managed Care – PPO

## 2020-04-07 DIAGNOSIS — J939 Pneumothorax, unspecified: Secondary | ICD-10-CM | POA: Diagnosis not present

## 2020-04-07 MED ORDER — INFLUENZA VAC SPLIT QUAD 0.5 ML IM SUSY: 0.5000 mL | PREFILLED_SYRINGE | INTRAMUSCULAR | Status: DC

## 2020-04-07 MED ORDER — BISACODYL 5 MG PO TBEC
5.0000 mg | DELAYED_RELEASE_TABLET | Freq: Every day | ORAL | Status: DC | PRN
  Administered 2020-04-07 – 2020-04-16 (×2): 5 mg via ORAL
  Filled 2020-04-07 (×2): qty 1

## 2020-04-07 MED ORDER — TRAMADOL HCL 50 MG PO TABS
50.0000 mg | ORAL_TABLET | Freq: Four times a day (QID) | ORAL | Status: DC | PRN
  Administered 2020-04-08 – 2020-04-16 (×9): 50 mg via ORAL
  Filled 2020-04-07 (×9): qty 1

## 2020-04-07 NOTE — Progress Notes (Addendum)
      [address]       [place] [address]             [phone]      Subjective:  Patient is breathing better.  Pain is well controlled.  Objective: Vital signs in last 24 hours: Temp:  [97.7 F (36.5 C)-98.4 F (36.9 C)] 98.4 F (36.9 C) (11/10 0618) Pulse Rate:  [53-74] 58 (11/10 0618) Cardiac Rhythm: Normal sinus rhythm (11/09 1902) Resp:  [12-25] 20 (11/09 1539) BP: (103-151)/(50-97) 114/83 (11/09 1539) SpO2:  [92 %-99 %] 97 % (11/10 0618)  Intake/Output from previous day: 11/09 0701 - 11/10 0700 In: 270 [P.O.:240] Out: 600 [Urine:600]  General appearance: alert, cooperative and no distress Heart: regular rate and rhythm Lungs: clear to auscultation bilaterally Wound: clean and dry  Lab Results: Recent Labs    04/05/20 1546  WBC 5.4  HGB 13.0  HCT 40.1  PLT 241   BMET:  Recent Labs    04/05/20 1546  NA 139  K 3.5  CL 104  CO2 25  GLUCOSE 83  BUN 14  CREATININE 0.75  CALCIUM 9.0    PT/INR:  Recent Labs    04/06/20 1617  LABPROT 12.9  INR 1.0   ABG    Component Value Date/Time   PHART 7.424 01/23/2017 0407   HCO3 25.3 01/23/2017 0407   TCO2 26 01/23/2017 0407   ACIDBASEDEF 1.8 01/21/2017 1330   O2SAT 99.0 01/23/2017 0407   CBG (last 3)  No results for input(s): GLUCAP in the last 72 hours.  Assessment/Plan:  1. Spontaneous Pneumothorax- patient had a right sided VATS 3 years ago, presented with 50% pneumothorax on right. IR placed pigtail to suction... CXR with resolution of pneumothorax 2. Dispo- patient stable, leave chest tube to suction today, repeat CXR in AM, care per primary  LOS: 2 days    Lowella Dandy, PA-C 04/07/2020  Patient examined and today's chest x-ray reviewed. The well-placed pigtail catheter by IR has completely expanded the right lung and there is no air leak with cough.  Continue with suction and daily chest x-ray. Patient's pain is well controlled and improving.  Lovett Sox MD

## 2020-04-07 NOTE — Progress Notes (Signed)
PROGRESS NOTE    Christine Guerrero  HYI:[id number] DOB: [birth year] DOA: 04/05/2020 PCP: Bernita Raisin., MD   Brief Narrative: Christine Guerrero is an 49 y.o. female with medical history significant ofprior PTx on R, VATS and talc pleurodesis in 2018. Pt with h/o chronic bronchitis, childhood asthma, non-smoker, no known A1AT. Found to have another 50% right PTx   Assessment & Plan:   Principal Problem:   Pneumothorax on right  Spontaneous pneumothorax on the right side: Status post chest tube placement by IR on 04/06/2020.  Management deferred to IR and CT surgery.  Anxiety/depression: Continue Zoloft and Ativan.  DVT prophylaxis: SCDs Start: 04/05/20 2149   Code Status: Full Code  Family Communication: None present at bedside.  Plan of care discussed with patient in length and he verbalized understanding and agreed with it.  Status is: Inpatient  Remains inpatient appropriate because:Inpatient level of care appropriate due to severity of illness   Dispo: The patient is from: Home              Anticipated d/c is to: Home              Anticipated d/c date is: 2 days              Patient currently is not medically stable to d/c.        Estimated body mass index is 22.28 kg/m as calculated from the following:   Height as of this encounter: 5\' 6"  (1.676 m).   Weight as of this encounter: 62.6 kg.      Nutritional status:               Consultants:   CT surgery and IR  Procedures:   Chest tube placement 04/06/2020  Antimicrobials:  Anti-infectives (From admission, onward)   None         Subjective: Seen and examined.  No complaints.  Feels better than yesterday.  Objective: Vitals:   04/06/20 2234 04/07/20 0618 04/07/20 0856 04/07/20 1210  BP:   130/83 124/84  Pulse: 62 (!) 58    Resp:   20 15  Temp: 98.1 F (36.7 C) 98.4 F (36.9 C) 98.7 F (37.1 C) 98.5 F (36.9 C)  TempSrc: Oral Oral Oral Oral  SpO2: 99% 97% 95% 98%    Weight:      Height:        Intake/Output Summary (Last 24 hours) at 04/07/2020 1307 Last data filed at 04/07/2020 0617 Gross per 24 hour  Intake 270 ml  Output 0 ml  Net 270 ml   Filed Weights   04/05/20 1544  Weight: 62.6 kg    Examination:  General exam: Appears calm and comfortable  Respiratory system: Diminished breath sounds at the bases bilaterally. Respiratory effort normal. Cardiovascular system: S1 & S2 heard, RRR. No JVD, murmurs, rubs, gallops or clicks. No pedal edema. Gastrointestinal system: Abdomen is nondistended, soft and nontender. No organomegaly or masses felt. Normal bowel sounds heard. Central nervous system: Alert and oriented. No focal neurological deficits. Extremities: Symmetric 5 x 5 power. Skin: No rashes, lesions or ulcers Psychiatry: Judgement and insight appear normal. Mood & affect appropriate.    Data Reviewed: I have personally reviewed following labs and imaging studies  CBC: Recent Labs  Lab 04/05/20 1546  WBC 5.4  HGB 13.0  HCT 40.1  MCV 90.9  PLT 241   Basic Metabolic Panel: Recent Labs  Lab 04/05/20 1546  NA 139  K 3.5  CL 104  CO2 25  GLUCOSE 83  BUN 14  CREATININE 0.75  CALCIUM 9.0   GFR: Estimated Creatinine Clearance: 79.6 mL/min (by C-G formula based on SCr of 0.75 mg/dL). Liver Function Tests: No results for input(s): AST, ALT, ALKPHOS, BILITOT, PROT, ALBUMIN in the last 168 hours. No results for input(s): LIPASE, AMYLASE in the last 168 hours. No results for input(s): AMMONIA in the last 168 hours. Coagulation Profile: Recent Labs  Lab 04/06/20 1617  INR 1.0   Cardiac Enzymes: No results for input(s): CKTOTAL, CKMB, CKMBINDEX, TROPONINI in the last 168 hours. BNP (last 3 results) No results for input(s): PROBNP in the last 8760 hours. HbA1C: No results for input(s): HGBA1C in the last 72 hours. CBG: No results for input(s): GLUCAP in the last 168 hours. Lipid Profile: No results for input(s):  CHOL, HDL, LDLCALC, TRIG, CHOLHDL, LDLDIRECT in the last 72 hours. Thyroid Function Tests: No results for input(s): TSH, T4TOTAL, FREET4, T3FREE, THYROIDAB in the last 72 hours. Anemia Panel: No results for input(s): VITAMINB12, FOLATE, FERRITIN, TIBC, IRON, RETICCTPCT in the last 72 hours. Sepsis Labs: No results for input(s): PROCALCITON, LATICACIDVEN in the last 168 hours.  Recent Results (from the past 240 hour(s))  Respiratory Panel by RT PCR (Flu A&B, Covid) - Nasopharyngeal Swab     Status: None   Collection Time: 04/05/20  7:00 PM   Specimen: Nasopharyngeal Swab  Result Value Ref Range Status   SARS Coronavirus 2 by RT PCR NEGATIVE NEGATIVE Final    Comment: (NOTE) SARS-CoV-2 target nucleic acids are NOT DETECTED.  The SARS-CoV-2 RNA is generally detectable in upper respiratoy specimens during the acute phase of infection. The lowest concentration of SARS-CoV-2 viral copies this assay can detect is 131 copies/mL. A negative result does not preclude SARS-Cov-2 infection and should not be used as the sole basis for treatment or other patient management decisions. A negative result may occur with  improper specimen collection/handling, submission of specimen other than nasopharyngeal swab, presence of viral mutation(s) within the areas targeted by this assay, and inadequate number of viral copies (<131 copies/mL). A negative result must be combined with clinical observations, patient history, and epidemiological information. The expected result is Negative.  Fact Sheet for Patients:  https://www.moore.com/https://www.fda.gov/media/142436/download  Fact Sheet for Healthcare Providers:  https://www.young.biz/https://www.fda.gov/media/142435/download  This test is no t yet approved or cleared by the [place] FDA and  has been authorized for detection and/or diagnosis of SARS-CoV-2 by FDA under an Emergency Use Authorization (EUA). This EUA will remain  in effect (meaning this test can be used) for the duration of  the COVID-19 declaration under Section 564(b)(1) of the Act, 21 U.S.C. section 360bbb-3(b)(1), unless the authorization is terminated or revoked sooner.     Influenza A by PCR NEGATIVE NEGATIVE Final   Influenza B by PCR NEGATIVE NEGATIVE Final    Comment: (NOTE) The Xpert Xpress SARS-CoV-2/FLU/RSV assay is intended as an aid in  the diagnosis of influenza from Nasopharyngeal swab specimens and  should not be used as a sole basis for treatment. Nasal washings and  aspirates are unacceptable for Xpert Xpress SARS-CoV-2/FLU/RSV  testing.  Fact Sheet for Patients: https://www.moore.com/https://www.fda.gov/media/142436/download  Fact Sheet for Healthcare Providers: https://www.young.biz/https://www.fda.gov/media/142435/download  This test is not yet approved or cleared by the [place] FDA and  has been authorized for detection and/or diagnosis of SARS-CoV-2 by  FDA under an Emergency Use Authorization (EUA). This EUA will remain  in effect (meaning this test can be used) for the duration of the  Covid-19 declaration under Section 564(b)(1) of the Act, 21  U.S.C. section 360bbb-3(b)(1), unless the authorization is  terminated or revoked. Performed at [facility], [address], [place], [place] [address]       Radiology Studies: DG Chest 1 View  Result Date: 04/06/2020 CLINICAL DATA:  Chest tube. EXAM: CHEST  1 VIEW COMPARISON:  April 05, 2020. FINDINGS: The heart size and mediastinal contours are within normal limits. Left lung is clear. Interval placement of right basilar chest tube. No pneumothorax is noted currently. Postsurgical changes are noted in the right upper lobe. Mild right basilar atelectasis or infiltrate is noted with small pleural effusion. The visualized skeletal structures are unremarkable. IMPRESSION: Interval placement of right basilar chest tube. No pneumothorax is noted currently. Mild right basilar atelectasis or infiltrate is noted with small pleural effusion. Electronically Signed   By:  Lupita Raider M.D.   On: 04/06/2020 17:49   DG Chest 2 View  Result Date: 04/05/2020 CLINICAL DATA:  49 year old female with chest pain. EXAM: CHEST - 2 VIEW COMPARISON:  Chest radiograph dated 02/21/2017. FINDINGS: There is background of emphysema. Postsurgical changes of the right upper lobe with surgical suture. There is a large area of pneumothorax at the right lung base. No focal consolidation or pleural effusion. The cardiac silhouette is within limits. No acute osseous pathology. Breast implant noted. IMPRESSION: Large right base pneumothorax. These results were called by telephone at the time of interpretation on 04/05/2020 at 4:27 pm to Dr. Jacqulyn Bath, who verbally acknowledged these results. Electronically Signed   By: Elgie Collard M.D.   On: 04/05/2020 16:30   CT Chest Wo Contrast  Result Date: 04/05/2020 CLINICAL DATA:  Short of breath, right pneumothorax EXAM: CT CHEST WITHOUT CONTRAST TECHNIQUE: Multidetector CT imaging of the chest was performed following the standard protocol without IV contrast. COMPARISON:  04/05/2020, 09/21/2016 FINDINGS: Cardiovascular: Unenhanced imaging of the heart demonstrates trace pericardial fluid. Normal caliber of the thoracic aorta. No significant atherosclerosis. Mediastinum/Nodes: No enlarged mediastinal or axillary lymph nodes. Thyroid gland, trachea, and esophagus demonstrate no significant findings. Lungs/Pleura: There is a large right pneumothorax, primarily subpulmonic in location. Numerous pleural adhesions are seen throughout the right hemithorax. Volume of pneumothorax estimated 50%. There is mild mediastinal shift to the left. Postsurgical changes are seen at the right apex. Compressive atelectasis is seen within the right lower lobe. No right pleural effusion. The left lung is clear.  Central airways are patent. Upper Abdomen: No acute abnormality. Musculoskeletal: No acute or destructive bony lesions. Reconstructed images demonstrate no additional  findings. IMPRESSION: 1. Large primarily sub pulmonic right pneumothorax, volume estimated 50%. Minimal mediastinal shift to the left. 2. Postsurgical changes at the right apex. Multiple pleural adhesions in the right pleural space likely reflect previous attempted pleurodesis. Electronically Signed   By: Sharlet Salina M.D.   On: 04/05/2020 20:33   DG Chest Port 1 View  Result Date: 04/07/2020 CLINICAL DATA:  Follow-up pneumothorax. EXAM: PORTABLE CHEST 1 VIEW COMPARISON:  Chest x-ray 04/06/2020 FINDINGS: The right basilar chest tube is stable. No residual pneumothorax is identified. Improving right basilar lung aeration. The left lung remains clear. IMPRESSION: 1. Stable right basilar chest tube.  No residual pneumothorax. 2. Improving aeration at the right lung base with resolving atelectasis. Electronically Signed   By: Rudie Meyer M.D.   On: 04/07/2020 07:47   CT [facility] PLEURAL DRAIN W/INDWELL CATH W/IMG GUIDE  Result Date: 04/06/2020 INDICATION: 49 year old female with recurrent spontaneous right-sided pneumothorax.  She presents for chest tube placement. EXAM: CT-guided chest tube placement MEDICATIONS: None. ANESTHESIA/SEDATION: Fentanyl 100 mcg IV; Versed 2 mg IV Moderate Sedation Time:  16 minutes The patient was continuously monitored during the procedure by the interventional radiology nurse under my direct supervision. COMPLICATIONS: None immediate. PROCEDURE: Informed written consent was obtained from the patient after a thorough discussion of the procedural risks, benefits and alternatives. All questions were addressed. Maximal Sterile Barrier Technique was utilized including caps, mask, sterile gowns, sterile gloves, sterile drape, hand hygiene and skin antiseptic. A timeout was performed prior to the initiation of the procedure. A planning axial CT scan was performed. The large right-sided pneumothorax was successfully identified. A suitable skin entry site over a right lower rib was  identified. The overlying skin was sterilely prepped and draped in the standard fashion using chlorhexidine skin prep. Local anesthesia was attained by infiltration with 1% lidocaine. A small dermatotomy was made. Using trocar technique, a 12 French pigtail thoracostomy tube was advanced over the rib and into the pleural space. The catheter was connected to low wall suction via an atrium. Follow-up chest CT imaging demonstrates good placement of the tube and successful re-expansion of the lung. The catheter was secured in place with 0 Prolene suture and sterile bandages were applied. IMPRESSION: Successful placement of a 12 French pigtail thoracostomy tube on the right with resolution of spontaneous pneumothorax. Electronically Signed   By: Malachy Moan M.D.   On: 04/06/2020 15:42    Scheduled Meds: . diclofenac  75 mg Oral Q breakfast  . estradiol  1 mg Oral Daily  . ezetimibe  10 mg Oral QPM  . loratadine  10 mg Oral Daily  . omega-3 acid ethyl esters  1 g Oral BID  . pantoprazole  40 mg Oral Daily  . sertraline  25 mg Oral Daily   Continuous Infusions:   LOS: 2 days   Time spent: 31 minutes   Hughie Closs, MD [facility]  04/07/2020, 1:07 PM   To contact the attending provider between 7A-7P or the covering provider during after hours 7P-7A, please log into the web site [email or web address].

## 2020-04-07 NOTE — Progress Notes (Addendum)
Received patient for care around 1900 from off-going nurse. MAR, chart, and labs reviewed. Patient alert and oriented x4. Oxygen saturation remained above 92% on room air. Skin assessed with no new skin issues noted. Right chest tube remains patent with noted 88ml of serous output this shift. Patient c/o pain to chest tube site 5/10. PRN pain medication given with effectiveness. Patient turned and repositioned every 2 hours and as needed with no assist. Patient continent x2. Vitals stable this shift. Call light in reach, bed in low position, wheels locked. No acute events this shift. Will continue to monitor for signs and symptoms of deterioration and report as needed.

## 2020-04-07 NOTE — Progress Notes (Signed)
Referring Physician(s): Hillary Bow [facility])  Supervising Physician: Irish Lack  Patient Status:  [facility] - In-pt  Chief Complaint: None  Subjective:  History of recurrent right spontaneous PTX s/p right chest tube placement in IR 04/06/2020. Patient awake and alert sitting in bed watching TV with no complaints at this time. Husband at bedside. States she feels much better today, dyspnea resolved s/p chest tube placement. Right chest tube site c/d/i.  CXR this AM: 1. Stable right basilar chest tube.  No residual pneumothorax. 2. Improving aeration at the right lung base with resolving atelectasis.   Allergies: Codeine  Medications: Prior to Admission medications   Medication Sig Start Date End Date Taking? Authorizing Provider  diclofenac (CATAFLAM) 50 MG tablet Take 50 mg by mouth daily.    Yes [provider]  estradiol (ESTRACE) 1 MG tablet Take 1 tablet by mouth daily.   Yes [provider]  ezetimibe (ZETIA) 10 MG tablet Take 10 mg by mouth every evening.   Yes [provider]  fexofenadine (ALLEGRA) 180 MG tablet Take 1 tablet (180 mg total) by mouth daily. 02/08/16  Yes Padgett, Pilar Grammes, MD  LORazepam (ATIVAN) 0.5 MG tablet Take 0.5 mg by mouth daily as needed for anxiety. 01/15/20  Yes [provider]  omega-3 acid ethyl esters (LOVAZA) 1 g capsule Take 1 g by mouth 2 (two) times daily.   Yes [provider]  omeprazole (PRILOSEC) 20 MG capsule Take 20 mg by mouth in the morning. 03/17/20  Yes [provider]  sertraline (ZOLOFT) 25 MG tablet Take 25 mg by mouth daily. 01/28/20  Yes [provider]     Vital Signs: BP 124/84   Pulse (!) 58   Temp 98.5 F (36.9 C) (Oral)   Resp 15   Ht 5\' 6"  (1.676 m)   Wt 138 lb 0.1 oz (62.6 kg)   SpO2 98%   BMI 22.28 kg/m   Physical Exam Vitals and nursing note reviewed.  Constitutional:      General: She is not in acute distress.    Appearance:  Normal appearance.  Pulmonary:     Effort: Pulmonary effort is normal. No respiratory distress.     Comments: On RA. Right chest tube site without tenderness, erythema, drainage, or active bleeding; approximately 46 cc output of serous fluid in pleure-vac; tube to suction with (-) air leak. Skin:    General: Skin is warm and dry.  Neurological:     Mental Status: She is alert and oriented to person, place, and time.     Imaging: DG Chest 1 View  Result Date: 04/06/2020 CLINICAL DATA:  Chest tube. EXAM: CHEST  1 VIEW COMPARISON:  April 05, 2020. FINDINGS: The heart size and mediastinal contours are within normal limits. Left lung is clear. Interval placement of right basilar chest tube. No pneumothorax is noted currently. Postsurgical changes are noted in the right upper lobe. Mild right basilar atelectasis or infiltrate is noted with small pleural effusion. The visualized skeletal structures are unremarkable. IMPRESSION: Interval placement of right basilar chest tube. No pneumothorax is noted currently. Mild right basilar atelectasis or infiltrate is noted with small pleural effusion. Electronically Signed   By: April 07, 2020 M.D.   On: 04/06/2020 17:49   DG Chest 2 View  Result Date: 04/05/2020 CLINICAL DATA:  49 year old female with chest pain. EXAM: CHEST - 2 VIEW COMPARISON:  Chest radiograph dated 02/21/2017. FINDINGS: There is background of emphysema. Postsurgical changes of  the right upper lobe with surgical suture. There is a large area of pneumothorax at the right lung base. No focal consolidation or pleural effusion. The cardiac silhouette is within limits. No acute osseous pathology. Breast implant noted. IMPRESSION: Large right base pneumothorax. These results were called by telephone at the time of interpretation on 04/05/2020 at 4:27 pm to Dr. Jacqulyn Bath, who verbally acknowledged these results. Electronically Signed   By: Elgie Collard M.D.   On: 04/05/2020 16:30   CT Chest Wo  Contrast  Result Date: 04/05/2020 CLINICAL DATA:  Short of breath, right pneumothorax EXAM: CT CHEST WITHOUT CONTRAST TECHNIQUE: Multidetector CT imaging of the chest was performed following the standard protocol without IV contrast. COMPARISON:  04/05/2020, 09/21/2016 FINDINGS: Cardiovascular: Unenhanced imaging of the heart demonstrates trace pericardial fluid. Normal caliber of the thoracic aorta. No significant atherosclerosis. Mediastinum/Nodes: No enlarged mediastinal or axillary lymph nodes. Thyroid gland, trachea, and esophagus demonstrate no significant findings. Lungs/Pleura: There is a large right pneumothorax, primarily subpulmonic in location. Numerous pleural adhesions are seen throughout the right hemithorax. Volume of pneumothorax estimated 50%. There is mild mediastinal shift to the left. Postsurgical changes are seen at the right apex. Compressive atelectasis is seen within the right lower lobe. No right pleural effusion. The left lung is clear.  Central airways are patent. Upper Abdomen: No acute abnormality. Musculoskeletal: No acute or destructive bony lesions. Reconstructed images demonstrate no additional findings. IMPRESSION: 1. Large primarily sub pulmonic right pneumothorax, volume estimated 50%. Minimal mediastinal shift to the left. 2. Postsurgical changes at the right apex. Multiple pleural adhesions in the right pleural space likely reflect previous attempted pleurodesis. Electronically Signed   By: Sharlet Salina M.D.   On: 04/05/2020 20:33   DG Chest Port 1 View  Result Date: 04/07/2020 CLINICAL DATA:  Follow-up pneumothorax. EXAM: PORTABLE CHEST 1 VIEW COMPARISON:  Chest x-ray 04/06/2020 FINDINGS: The right basilar chest tube is stable. No residual pneumothorax is identified. Improving right basilar lung aeration. The left lung remains clear. IMPRESSION: 1. Stable right basilar chest tube.  No residual pneumothorax. 2. Improving aeration at the right lung base with resolving  atelectasis. Electronically Signed   By: Rudie Meyer M.D.   On: 04/07/2020 07:47   CT [facility] PLEURAL DRAIN W/INDWELL CATH W/IMG GUIDE  Result Date: 04/06/2020 INDICATION: 49 year old female with recurrent spontaneous right-sided pneumothorax. She presents for chest tube placement. EXAM: CT-guided chest tube placement MEDICATIONS: None. ANESTHESIA/SEDATION: Fentanyl 100 mcg IV; Versed 2 mg IV Moderate Sedation Time:  16 minutes The patient was continuously monitored during the procedure by the interventional radiology nurse under my direct supervision. COMPLICATIONS: None immediate. PROCEDURE: Informed written consent was obtained from the patient after a thorough discussion of the procedural risks, benefits and alternatives. All questions were addressed. Maximal Sterile Barrier Technique was utilized including caps, mask, sterile gowns, sterile gloves, sterile drape, hand hygiene and skin antiseptic. A timeout was performed prior to the initiation of the procedure. A planning axial CT scan was performed. The large right-sided pneumothorax was successfully identified. A suitable skin entry site over a right lower rib was identified. The overlying skin was sterilely prepped and draped in the standard fashion using chlorhexidine skin prep. Local anesthesia was attained by infiltration with 1% lidocaine. A small dermatotomy was made. Using trocar technique, a 12 French pigtail thoracostomy tube was advanced over the rib and into the pleural space. The catheter was connected to low wall suction via an atrium. Follow-up chest CT imaging demonstrates good placement of  the tube and successful re-expansion of the lung. The catheter was secured in place with 0 Prolene suture and sterile bandages were applied. IMPRESSION: Successful placement of a 12 French pigtail thoracostomy tube on the right with resolution of spontaneous pneumothorax. Electronically Signed   By: Malachy Moan M.D.   On: 04/06/2020 15:42     Labs:  CBC: Recent Labs    04/05/20 1546  WBC 5.4  HGB 13.0  HCT 40.1  PLT 241    COAGS: Recent Labs    04/06/20 1617  INR 1.0    BMP: Recent Labs    04/05/20 1546  NA 139  K 3.5  CL 104  CO2 25  GLUCOSE 83  BUN 14  CALCIUM 9.0  CREATININE 0.75  GFRNONAA >60     Assessment and Plan:  History of recurrent right spontaneous PTX s/p right chest tube placement in IR 04/06/2020. Right chest tube stable with approximately 46 cc output of serous fluid in pleure-vac. Continue current drain management- tube to be kept to suction, further management per TCTS. Further plans per TRH/TCTS- appreciate and agree with management. IR to follow.   Electronically Signed: Elwin Mocha, PA-C 04/07/2020, 2:51 PM   I spent a total of 25 Minutes at the the patient's bedside AND on the patient's hospital floor or unit, greater than 50% of which was counseling/coordinating care for right spontaneous PTX s/p right chest tube placement.

## 2020-04-07 NOTE — Plan of Care (Signed)

## 2020-04-08 ENCOUNTER — Inpatient Hospital Stay (HOSPITAL_COMMUNITY): Payer: BC Managed Care – PPO

## 2020-04-08 DIAGNOSIS — J939 Pneumothorax, unspecified: Secondary | ICD-10-CM | POA: Diagnosis not present

## 2020-04-08 NOTE — Progress Notes (Signed)
PROGRESS NOTE    Christine Guerrero  FUX:[id number] DOB: Oct 16, 1970 DOA: 04/05/2020 PCP: Bernita Raisin., MD   Brief Narrative: Christine Guerrero is an 49 y.o. female with medical history significant ofprior PTx on R, VATS and talc pleurodesis in 2018. Pt with h/o chronic bronchitis, childhood asthma, non-smoker, no known A1AT. Found to have another 50% right PTx   Assessment & Plan:   Principal Problem:   Pneumothorax on right  Spontaneous pneumothorax on the right side: Status post chest tube placement by IR on 04/06/2020.  On waterseal today.  Management deferred to IR and CT surgery.  Anxiety/depression: Continue Zoloft and Ativan.  DVT prophylaxis: SCDs Start: 04/05/20 2149   Code Status: Full Code  Family Communication: None present at bedside.  Plan of care discussed with patient in length and he verbalized understanding and agreed with it.  Status is: Inpatient  Remains inpatient appropriate because:Inpatient level of care appropriate due to severity of illness   Dispo: The patient is from: Home              Anticipated d/c is to: Home              Anticipated d/c date is: 2 days              Patient currently is not medically stable to d/c.        Estimated body mass index is 22.28 kg/m as calculated from the following:   Height as of this encounter: 5\' 6"  (1.676 m).   Weight as of this encounter: 62.6 kg.      Nutritional status:               Consultants:   CT surgery and IR  Procedures:   Chest tube placement 04/06/2020  Antimicrobials:  Anti-infectives (From admission, onward)   None         Subjective: Patient seen and examined.  She has no complaints.   Objective: Vitals:   04/07/20 1210 04/07/20 1604 04/07/20 2130 04/08/20 0605  BP: 124/84 119/83 112/64   Pulse:   (!) 52 (!) 51  Resp: 15 13    Temp: 98.5 F (36.9 C) 98.9 F (37.2 C) 98.2 F (36.8 C) (!) 97.5 F (36.4 C)  TempSrc: Oral Oral Oral Oral  SpO2:  98% 100% 95% 98%  Weight:      Height:        Intake/Output Summary (Last 24 hours) at 04/08/2020 0918 Last data filed at 04/08/2020 0555 Gross per 24 hour  Intake 1240 ml  Output 30 ml  Net 1210 ml   Filed Weights   04/05/20 1544  Weight: 62.6 kg    Examination:  General exam: Appears calm and comfortable  Respiratory system: Clear to auscultation. Respiratory effort normal. Cardiovascular system: S1 & S2 heard, RRR. No JVD, murmurs, rubs, gallops or clicks. No pedal edema. Gastrointestinal system: Abdomen is nondistended, soft and nontender. No organomegaly or masses felt. Normal bowel sounds heard. Central nervous system: Alert and oriented. No focal neurological deficits. Extremities: Symmetric 5 x 5 power. Skin: No rashes, lesions or ulcers.  Psychiatry: Judgement and insight appear normal. Mood & affect appropriate.     Data Reviewed: I have personally reviewed following labs and imaging studies  CBC: Recent Labs  Lab 04/05/20 1546  WBC 5.4  HGB 13.0  HCT 40.1  MCV 90.9  PLT 241   Basic Metabolic Panel: Recent Labs  Lab 04/05/20 1546  NA 139  K 3.5  CL  104  CO2 25  GLUCOSE 83  BUN 14  CREATININE 0.75  CALCIUM 9.0   GFR: Estimated Creatinine Clearance: 79.6 mL/min (by C-G formula based on SCr of 0.75 mg/dL). Liver Function Tests: No results for input(s): AST, ALT, ALKPHOS, BILITOT, PROT, ALBUMIN in the last 168 hours. No results for input(s): LIPASE, AMYLASE in the last 168 hours. No results for input(s): AMMONIA in the last 168 hours. Coagulation Profile: Recent Labs  Lab 04/06/20 1617  INR 1.0   Cardiac Enzymes: No results for input(s): CKTOTAL, CKMB, CKMBINDEX, TROPONINI in the last 168 hours. BNP (last 3 results) No results for input(s): PROBNP in the last 8760 hours. HbA1C: No results for input(s): HGBA1C in the last 72 hours. CBG: No results for input(s): GLUCAP in the last 168 hours. Lipid Profile: No results for input(s): CHOL,  HDL, LDLCALC, TRIG, CHOLHDL, LDLDIRECT in the last 72 hours. Thyroid Function Tests: No results for input(s): TSH, T4TOTAL, FREET4, T3FREE, THYROIDAB in the last 72 hours. Anemia Panel: No results for input(s): VITAMINB12, FOLATE, FERRITIN, TIBC, IRON, RETICCTPCT in the last 72 hours. Sepsis Labs: No results for input(s): PROCALCITON, LATICACIDVEN in the last 168 hours.  Recent Results (from the past 240 hour(s))  Respiratory Panel by RT PCR (Flu A&B, Covid) - Nasopharyngeal Swab     Status: None   Collection Time: 04/05/20  7:00 PM   Specimen: Nasopharyngeal Swab  Result Value Ref Range Status   SARS Coronavirus 2 by RT PCR NEGATIVE NEGATIVE Final    Comment: (NOTE) SARS-CoV-2 target nucleic acids are NOT DETECTED.  The SARS-CoV-2 RNA is generally detectable in upper respiratoy specimens during the acute phase of infection. The lowest concentration of SARS-CoV-2 viral copies this assay can detect is 131 copies/mL. A negative result does not preclude SARS-Cov-2 infection and should not be used as the sole basis for treatment or other patient management decisions. A negative result may occur with  improper specimen collection/handling, submission of specimen other than nasopharyngeal swab, presence of viral mutation(s) within the areas targeted by this assay, and inadequate number of viral copies (<131 copies/mL). A negative result must be combined with clinical observations, patient history, and epidemiological information. The expected result is Negative.  Fact Sheet for Patients:  https://www.moore.com/  Fact Sheet for Healthcare Providers:  https://www.young.biz/  This test is no t yet approved or cleared by the [place] FDA and  has been authorized for detection and/or diagnosis of SARS-CoV-2 by FDA under an Emergency Use Authorization (EUA). This EUA will remain  in effect (meaning this test can be used) for the duration of  the COVID-19 declaration under Section 564(b)(1) of the Act, 21 U.S.C. section 360bbb-3(b)(1), unless the authorization is terminated or revoked sooner.     Influenza A by PCR NEGATIVE NEGATIVE Final   Influenza B by PCR NEGATIVE NEGATIVE Final    Comment: (NOTE) The Xpert Xpress SARS-CoV-2/FLU/RSV assay is intended as an aid in  the diagnosis of influenza from Nasopharyngeal swab specimens and  should not be used as a sole basis for treatment. Nasal washings and  aspirates are unacceptable for Xpert Xpress SARS-CoV-2/FLU/RSV  testing.  Fact Sheet for Patients: https://www.moore.com/  Fact Sheet for Healthcare Providers: https://www.young.biz/  This test is not yet approved or cleared by the [place] FDA and  has been authorized for detection and/or diagnosis of SARS-CoV-2 by  FDA under an Emergency Use Authorization (EUA). This EUA will remain  in effect (meaning this test can be used) for the duration  of the  Covid-19 declaration under Section 564(b)(1) of the Act, 21  U.S.C. section 360bbb-3(b)(1), unless the authorization is  terminated or revoked. Performed at [facility], [address], [place], [place] [address]       Radiology Studies: DG Chest 1 View  Result Date: 04/06/2020 CLINICAL DATA:  Chest tube. EXAM: CHEST  1 VIEW COMPARISON:  April 05, 2020. FINDINGS: The heart size and mediastinal contours are within normal limits. Left lung is clear. Interval placement of right basilar chest tube. No pneumothorax is noted currently. Postsurgical changes are noted in the right upper lobe. Mild right basilar atelectasis or infiltrate is noted with small pleural effusion. The visualized skeletal structures are unremarkable. IMPRESSION: Interval placement of right basilar chest tube. No pneumothorax is noted currently. Mild right basilar atelectasis or infiltrate is noted with small pleural effusion. Electronically Signed   By:  Lupita Raider M.D.   On: 04/06/2020 17:49   DG CHEST PORT 1 VIEW  Result Date: 04/08/2020 CLINICAL DATA:  Pneumothorax EXAM: PORTABLE CHEST 1 VIEW COMPARISON:  Yesterday FINDINGS: Right base chest tube. Stable hazy density at the right base and sutures at the right upper lobe. No visible pneumothorax or pleural effusion. Normal heart size and mediastinal contours. IMPRESSION: No visible pneumothorax. Electronically Signed   By: Marnee Spring M.D.   On: 04/08/2020 04:10   DG Chest Port 1 View  Result Date: 04/07/2020 CLINICAL DATA:  Follow-up pneumothorax. EXAM: PORTABLE CHEST 1 VIEW COMPARISON:  Chest x-ray 04/06/2020 FINDINGS: The right basilar chest tube is stable. No residual pneumothorax is identified. Improving right basilar lung aeration. The left lung remains clear. IMPRESSION: 1. Stable right basilar chest tube.  No residual pneumothorax. 2. Improving aeration at the right lung base with resolving atelectasis. Electronically Signed   By: Rudie Meyer M.D.   On: 04/07/2020 07:47   CT [facility] PLEURAL DRAIN W/INDWELL CATH W/IMG GUIDE  Result Date: 04/06/2020 INDICATION: 49 year old female with recurrent spontaneous right-sided pneumothorax. She presents for chest tube placement. EXAM: CT-guided chest tube placement MEDICATIONS: None. ANESTHESIA/SEDATION: Fentanyl 100 mcg IV; Versed 2 mg IV Moderate Sedation Time:  16 minutes The patient was continuously monitored during the procedure by the interventional radiology nurse under my direct supervision. COMPLICATIONS: None immediate. PROCEDURE: Informed written consent was obtained from the patient after a thorough discussion of the procedural risks, benefits and alternatives. All questions were addressed. Maximal Sterile Barrier Technique was utilized including caps, mask, sterile gowns, sterile gloves, sterile drape, hand hygiene and skin antiseptic. A timeout was performed prior to the initiation of the procedure. A planning axial CT scan was  performed. The large right-sided pneumothorax was successfully identified. A suitable skin entry site over a right lower rib was identified. The overlying skin was sterilely prepped and draped in the standard fashion using chlorhexidine skin prep. Local anesthesia was attained by infiltration with 1% lidocaine. A small dermatotomy was made. Using trocar technique, a 12 French pigtail thoracostomy tube was advanced over the rib and into the pleural space. The catheter was connected to low wall suction via an atrium. Follow-up chest CT imaging demonstrates good placement of the tube and successful re-expansion of the lung. The catheter was secured in place with 0 Prolene suture and sterile bandages were applied. IMPRESSION: Successful placement of a 12 French pigtail thoracostomy tube on the right with resolution of spontaneous pneumothorax. Electronically Signed   By: Malachy Moan M.D.   On: 04/06/2020 15:42    Scheduled Meds: .  diclofenac  75 mg Oral Q breakfast  . estradiol  1 mg Oral Daily  . ezetimibe  10 mg Oral QPM  . influenza vac split quadrivalent PF  0.5 mL Intramuscular Tomorrow-1000  . loratadine  10 mg Oral Daily  . omega-3 acid ethyl esters  1 g Oral BID  . pantoprazole  40 mg Oral Daily  . sertraline  25 mg Oral Daily   Continuous Infusions:   LOS: 3 days   Time spent: 25 minutes   Hughie Clossavi Tanish Prien, MD [facility]  04/08/2020, 9:18 AM   To contact the attending provider between 7A-7P or the covering provider during after hours 7P-7A, please log into the web site [email or web address].

## 2020-04-08 NOTE — Progress Notes (Addendum)
      [address]       [place] [address]             [phone]      Subjective:  No new complaints.  Continues to do well.    Objective: Vital signs in last 24 hours: Temp:  [97.5 F (36.4 C)-98.9 F (37.2 C)] 97.5 F (36.4 C) (11/11 0605) Pulse Rate:  [51-52] 51 (11/11 0605) Cardiac Rhythm: Normal sinus rhythm (11/11 0700) Resp:  [13-20] 13 (11/10 1604) BP: (112-130)/(64-84) 112/64 (11/10 2130) SpO2:  [95 %-100 %] 98 % (11/11 0605)  Intake/Output from previous day: 11/10 0701 - 11/11 0700 In: 1240 [P.O.:1240] Out: 30 [Chest Tube:30]  General appearance: alert, cooperative and no distress Heart: regular rate and rhythm Lungs: clear to auscultation bilaterally Wound: clean and dry  Lab Results: Recent Labs    04/05/20 1546  WBC 5.4  HGB 13.0  HCT 40.1  PLT 241   BMET:  Recent Labs    04/05/20 1546  NA 139  K 3.5  CL 104  CO2 25  GLUCOSE 83  BUN 14  CREATININE 0.75  CALCIUM 9.0    PT/INR:  Recent Labs    04/06/20 1617  LABPROT 12.9  INR 1.0   ABG    Component Value Date/Time   PHART 7.424 01/23/2017 0407   HCO3 25.3 01/23/2017 0407   TCO2 26 01/23/2017 0407   ACIDBASEDEF 1.8 01/21/2017 1330   O2SAT 99.0 01/23/2017 0407   CBG (last 3)  No results for input(s): GLUCAP in the last 72 hours.  Assessment/Plan:  1. Spontaneous Pneumothorax- S/P IR Pigtail placement- no air leak present, CXR remains free from pneumothorax, will place chest tube to water seal, repeat CXR in AM 2. Care per primary   LOS: 3 days    Lowella Dandy, PA-C 04/08/2020  patient examined and CXR reviewed R spontaneous pntx responding to chest tube management. After suction was removed from the pleurovac this am  the patient felt air bubbles in the R chest.With suction re-applied air bubbles were removed through the pleurovac- so will continue suction today as the lung has not healed. Repeat  CXR in am.  Plan d/w patient.  P Donata Clay MD  P  Donata Clay MD

## 2020-04-08 NOTE — Plan of Care (Signed)

## 2020-04-09 ENCOUNTER — Inpatient Hospital Stay (HOSPITAL_COMMUNITY): Payer: BC Managed Care – PPO

## 2020-04-09 DIAGNOSIS — J939 Pneumothorax, unspecified: Secondary | ICD-10-CM | POA: Diagnosis not present

## 2020-04-09 NOTE — Plan of Care (Signed)

## 2020-04-09 NOTE — Progress Notes (Signed)
PROGRESS NOTE    Christine Guerrero  KNL:[id number] DOB: Apr 09, 1971 DOA: 04/05/2020 PCP: Bernita Raisin., MD   Brief Narrative: Christine Guerrero is an 49 y.o. female with medical history significant ofprior PTx on R, VATS and talc pleurodesis in 2018. Pt with h/o chronic bronchitis, childhood asthma, non-smoker, no known A1AT. Found to have another 50% right PTx   Assessment & Plan:   Principal Problem:   Pneumothorax on right  Spontaneous pneumothorax on the right side: Status post chest tube placement by IR on 04/06/2020.  Was placed on waterseal yesterday but then she had some crepitus so she was placed back on suction.  X-ray today shows no pneumothorax..  Management deferred to IR and CT surgery.  Anxiety/depression: Continue Zoloft and Ativan.  DVT prophylaxis: SCDs Start: 04/05/20 2149   Code Status: Full Code  Family Communication: None present at bedside.  Plan of care discussed with patient in length and he verbalized understanding and agreed with it.  Status is: Inpatient  Remains inpatient appropriate because:Inpatient level of care appropriate due to severity of illness   Dispo: The patient is from: Home              Anticipated d/c is to: Home              Anticipated d/c date is: 2 days              Patient currently is not medically stable to d/c.        Estimated body mass index is 22.28 kg/m as calculated from the following:   Height as of this encounter: 5\' 6"  (1.676 m).   Weight as of this encounter: 62.6 kg.      Nutritional status:               Consultants:   CT surgery and IR  Procedures:   Chest tube placement 04/06/2020  Antimicrobials:  Anti-infectives (From admission, onward)   None         Subjective: Seen and examined.  No complaints.  Objective: Vitals:   04/08/20 1604 04/08/20 2220 04/09/20 0607 04/09/20 0807  BP: 134/69  114/70 (!) 118/52  Pulse: (!) 58 (!) 53 (!) 50 62  Resp: 19     Temp: 98.7 F  (37.1 C) 98.1 F (36.7 C) 97.8 F (36.6 C) 98.1 F (36.7 C)  TempSrc:  Oral Oral Oral  SpO2: 100% 98% 100%   Weight:      Height:        Intake/Output Summary (Last 24 hours) at 04/09/2020 1047 Last data filed at 04/09/2020 13/04/2020 Gross per 24 hour  Intake --  Output 26 ml  Net -26 ml   Filed Weights   04/05/20 1544  Weight: 62.6 kg    Examination:  General exam: Appears calm and comfortable  Respiratory system: Clear to auscultation. Respiratory effort normal.  Chest tube placed on the right side. Cardiovascular system: S1 & S2 heard, RRR. No JVD, murmurs, rubs, gallops or clicks. No pedal edema. Gastrointestinal system: Abdomen is nondistended, soft and nontender. No organomegaly or masses felt. Normal bowel sounds heard. Central nervous system: Alert and oriented. No focal neurological deficits. Extremities: Symmetric 5 x 5 power. Skin: No rashes, lesions or ulcers.  Psychiatry: Judgement and insight appear normal. Mood & affect appropriate.   Data Reviewed: I have personally reviewed following labs and imaging studies  CBC: Recent Labs  Lab 04/05/20 1546  WBC 5.4  HGB 13.0  HCT 40.1  MCV  90.9  PLT 241   Basic Metabolic Panel: Recent Labs  Lab 04/05/20 1546  NA 139  K 3.5  CL 104  CO2 25  GLUCOSE 83  BUN 14  CREATININE 0.75  CALCIUM 9.0   GFR: Estimated Creatinine Clearance: 79.6 mL/min (by C-G formula based on SCr of 0.75 mg/dL). Liver Function Tests: No results for input(s): AST, ALT, ALKPHOS, BILITOT, PROT, ALBUMIN in the last 168 hours. No results for input(s): LIPASE, AMYLASE in the last 168 hours. No results for input(s): AMMONIA in the last 168 hours. Coagulation Profile: Recent Labs  Lab 04/06/20 1617  INR 1.0   Cardiac Enzymes: No results for input(s): CKTOTAL, CKMB, CKMBINDEX, TROPONINI in the last 168 hours. BNP (last 3 results) No results for input(s): PROBNP in the last 8760 hours. HbA1C: No results for input(s): HGBA1C in the  last 72 hours. CBG: No results for input(s): GLUCAP in the last 168 hours. Lipid Profile: No results for input(s): CHOL, HDL, LDLCALC, TRIG, CHOLHDL, LDLDIRECT in the last 72 hours. Thyroid Function Tests: No results for input(s): TSH, T4TOTAL, FREET4, T3FREE, THYROIDAB in the last 72 hours. Anemia Panel: No results for input(s): VITAMINB12, FOLATE, FERRITIN, TIBC, IRON, RETICCTPCT in the last 72 hours. Sepsis Labs: No results for input(s): PROCALCITON, LATICACIDVEN in the last 168 hours.  Recent Results (from the past 240 hour(s))  Respiratory Panel by RT PCR (Flu A&B, Covid) - Nasopharyngeal Swab     Status: None   Collection Time: 04/05/20  7:00 PM   Specimen: Nasopharyngeal Swab  Result Value Ref Range Status   SARS Coronavirus 2 by RT PCR NEGATIVE NEGATIVE Final    Comment: (NOTE) SARS-CoV-2 target nucleic acids are NOT DETECTED.  The SARS-CoV-2 RNA is generally detectable in upper respiratoy specimens during the acute phase of infection. The lowest concentration of SARS-CoV-2 viral copies this assay can detect is 131 copies/mL. A negative result does not preclude SARS-Cov-2 infection and should not be used as the sole basis for treatment or other patient management decisions. A negative result may occur with  improper specimen collection/handling, submission of specimen other than nasopharyngeal swab, presence of viral mutation(s) within the areas targeted by this assay, and inadequate number of viral copies (<131 copies/mL). A negative result must be combined with clinical observations, patient history, and epidemiological information. The expected result is Negative.  Fact Sheet for Patients:  https://www.moore.com/  Fact Sheet for Healthcare Providers:  https://www.young.biz/  This test is no t yet approved or cleared by the [place] FDA and  has been authorized for detection and/or diagnosis of SARS-CoV-2 by FDA under an  Emergency Use Authorization (EUA). This EUA will remain  in effect (meaning this test can be used) for the duration of the COVID-19 declaration under Section 564(b)(1) of the Act, 21 U.S.C. section 360bbb-3(b)(1), unless the authorization is terminated or revoked sooner.     Influenza A by PCR NEGATIVE NEGATIVE Final   Influenza B by PCR NEGATIVE NEGATIVE Final    Comment: (NOTE) The Xpert Xpress SARS-CoV-2/FLU/RSV assay is intended as an aid in  the diagnosis of influenza from Nasopharyngeal swab specimens and  should not be used as a sole basis for treatment. Nasal washings and  aspirates are unacceptable for Xpert Xpress SARS-CoV-2/FLU/RSV  testing.  Fact Sheet for Patients: https://www.moore.com/  Fact Sheet for Healthcare Providers: https://www.young.biz/  This test is not yet approved or cleared by the [place] FDA and  has been authorized for detection and/or diagnosis of SARS-CoV-2 by  FDA under an Emergency Use Authorization (EUA). This EUA will remain  in effect (meaning this test can be used) for the duration of the  Covid-19 declaration under Section 564(b)(1) of the Act, 21  U.S.C. section 360bbb-3(b)(1), unless the authorization is  terminated or revoked. Performed at [facility], [address] [place], [place] [address]       Radiology Studies: DG CHEST PORT 1 VIEW  Result Date: 04/09/2020 CLINICAL DATA:  Follow-up right-sided pneumothorax. EXAM: PORTABLE CHEST 1 VIEW COMPARISON:  Chest x-ray 04/08/2020 FINDINGS: The right basilar chest tube is stable. No definite right-sided pneumothorax. Stable surgical changes involving the right upper lobe. No pleural effusions. No pulmonary infiltrates. IMPRESSION: Stable right basilar chest tube without definite pneumothorax. Electronically Signed   By: Rudie Meyer M.D.   On: 04/09/2020 07:11   DG CHEST PORT 1 VIEW  Result Date: 04/08/2020 CLINICAL DATA:   Pneumothorax EXAM: PORTABLE CHEST 1 VIEW COMPARISON:  Yesterday FINDINGS: Right base chest tube. Stable hazy density at the right base and sutures at the right upper lobe. No visible pneumothorax or pleural effusion. Normal heart size and mediastinal contours. IMPRESSION: No visible pneumothorax. Electronically Signed   By: Marnee Spring M.D.   On: 04/08/2020 04:10    Scheduled Meds: . diclofenac  75 mg Oral Q breakfast  . estradiol  1 mg Oral Daily  . ezetimibe  10 mg Oral QPM  . influenza vac split quadrivalent PF  0.5 mL Intramuscular Tomorrow-1000  . loratadine  10 mg Oral Daily  . pantoprazole  40 mg Oral Daily  . sertraline  25 mg Oral Daily   Continuous Infusions:   LOS: 4 days   Time spent: 24 minutes   Hughie Closs, MD [facility]  04/09/2020, 10:47 AM   To contact the attending provider between 7A-7P or the covering provider during after hours 7P-7A, please log into the web site [email or web address].

## 2020-04-09 NOTE — Progress Notes (Signed)
° °   °  [address]       [place] [address]             [phone]       Subjective:  Patient up getting cleaned up.  She states she can feel some air movement along her chest tube.  Her chest tube got put back on suction yesterday.  Objective: Vital signs in last 24 hours: Temp:  [97.8 F (36.6 C)-98.7 F (37.1 C)] 98.1 F (36.7 C) (11/12 0807) Pulse Rate:  [50-62] 62 (11/12 0807) Cardiac Rhythm: Normal sinus rhythm (11/11 1900) Resp:  [19] 19 (11/11 1604) BP: (114-134)/(52-70) 118/52 (11/12 0807) SpO2:  [98 %-100 %] 100 % (11/12 0607)  Intake/Output from previous day: 11/11 0701 - 11/12 0700 In: -  Out: 26 [Chest Tube:26]  General appearance: alert, cooperative and no distress Heart: regular rate and rhythm Lungs: clear to auscultation bilaterally Wound: clean and dry  Lab Results: No results for input(s): WBC, HGB, HCT, PLT in the last 72 hours. BMET: No results for input(s): NA, K, CL, CO2, GLUCOSE, BUN, CREATININE, CALCIUM in the last 72 hours.  PT/INR: Recent Labs    04/06/20 1617  LABPROT 12.9  INR 1.0   ABG    Component Value Date/Time   PHART 7.424 01/23/2017 0407   HCO3 25.3 01/23/2017 0407   TCO2 26 01/23/2017 0407   ACIDBASEDEF 1.8 01/21/2017 1330   O2SAT 99.0 01/23/2017 0407   CBG (last 3)  No results for input(s): GLUCAP in the last 72 hours.  Assessment/Plan:  1. Spontaneous Pneumothorax- chest tube was placed back on suction yesterday as patient could feel air movement yesterday, this morning, CXR remains free from pneumothorax.... its currently on water seal as she is up at the sink.  There is an intermittent air leak present, will leave chest tube to water seal, repeat CXR in AM   LOS: 4 days    Lowella Dandy, PA-C' 04/09/2020

## 2020-04-10 ENCOUNTER — Inpatient Hospital Stay (HOSPITAL_COMMUNITY): Payer: BC Managed Care – PPO

## 2020-04-10 DIAGNOSIS — J939 Pneumothorax, unspecified: Secondary | ICD-10-CM | POA: Diagnosis not present

## 2020-04-10 LAB — CBC WITH DIFFERENTIAL/PLATELET
Abs Immature Granulocytes: 0.01 10*3/uL (ref 0.00–0.07)
Basophils Absolute: 0.1 10*3/uL (ref 0.0–0.1)
Basophils Relative: 1 %
Eosinophils Absolute: 0.6 10*3/uL — ABNORMAL HIGH (ref 0.0–0.5)
Eosinophils Relative: 15 %
HCT: 37.9 % (ref 36.0–46.0)
Hemoglobin: 12.4 g/dL (ref 12.0–15.0)
Immature Granulocytes: 0 %
Lymphocytes Relative: 38 %
Lymphs Abs: 1.6 10*3/uL (ref 0.7–4.0)
MCH: 29.3 pg (ref 26.0–34.0)
MCHC: 32.7 g/dL (ref 30.0–36.0)
MCV: 89.6 fL (ref 80.0–100.0)
Monocytes Absolute: 0.2 10*3/uL (ref 0.1–1.0)
Monocytes Relative: 6 %
Neutro Abs: 1.6 10*3/uL — ABNORMAL LOW (ref 1.7–7.7)
Neutrophils Relative %: 40 %
Platelets: 219 10*3/uL (ref 150–400)
RBC: 4.23 MIL/uL (ref 3.87–5.11)
RDW: 13.4 % (ref 11.5–15.5)
WBC: 4.2 10*3/uL (ref 4.0–10.5)
nRBC: 0 % (ref 0.0–0.2)

## 2020-04-10 LAB — BASIC METABOLIC PANEL
Anion gap: 7 (ref 5–15)
BUN: 12 mg/dL (ref 6–20)
CO2: 27 mmol/L (ref 22–32)
Calcium: 9 mg/dL (ref 8.9–10.3)
Chloride: 101 mmol/L (ref 98–111)
Creatinine, Ser: 0.83 mg/dL (ref 0.44–1.00)
GFR, Estimated: >60 mL/min (ref >60)
Glucose, Bld: 81 mg/dL (ref 70–99)
Potassium: 4.1 mmol/L (ref 3.5–5.1)
Sodium: 135 mmol/L (ref 135–145)

## 2020-04-10 LAB — MAGNESIUM: Magnesium: 1.9 mg/dL (ref 1.7–2.4)

## 2020-04-10 NOTE — Progress Notes (Addendum)
[address]       [place] [address]             [phone]         Subjective: Feels well  Objective: Vital signs in last 24 hours: Temp:  [97.6 F (36.4 C)-98.2 F (36.8 C)] 97.6 F (36.4 C) (11/13 0556) Pulse Rate:  [52-82] 52 (11/13 0556) Cardiac Rhythm: Normal sinus rhythm (11/13 0700) Resp:  [18] 18 (11/13 0556) BP: (110-126)/(64-77) 126/67 (11/13 0556) SpO2:  [99 %] 99 % (11/13 0556)  Hemodynamic parameters for last 24 hours:    Intake/Output from previous day: 11/12 0701 - 11/13 0700 In: -  Out: 20 [Chest Tube:20] Intake/Output this shift: No intake/output data recorded.  General appearance: alert, cooperative and no distress Heart: regular rate and rhythm Lungs: clear to auscultation bilaterally  Lab Results: Recent Labs    04/10/20 0931  WBC 4.2  HGB 12.4  HCT 37.9  PLT 219   BMET:  Recent Labs    04/10/20 0931  NA 135  K 4.1  CL 101  CO2 27  GLUCOSE 81  BUN 12  CREATININE 0.83  CALCIUM 9.0    PT/INR: No results for input(s): LABPROT, INR in the last 72 hours. ABG    Component Value Date/Time   PHART 7.424 01/23/2017 0407   HCO3 25.3 01/23/2017 0407   TCO2 26 01/23/2017 0407   ACIDBASEDEF 1.8 01/21/2017 1330   O2SAT 99.0 01/23/2017 0407   CBG (last 3)  No results for input(s): GLUCAP in the last 72 hours.  Meds Scheduled Meds: . diclofenac  75 mg Oral Q breakfast  . estradiol  1 mg Oral Daily  . ezetimibe  10 mg Oral QPM  . influenza vac split quadrivalent PF  0.5 mL Intramuscular Tomorrow-1000  . loratadine  10 mg Oral Daily  . pantoprazole  40 mg Oral Daily  . sertraline  25 mg Oral Daily   Continuous Infusions: PRN Meds:.acetaminophen **OR** acetaminophen, bisacodyl, chlorpheniramine-HYDROcodone, ketorolac, LORazepam, morphine injection, ondansetron **OR** ondansetron (ZOFRAN) IV, traMADol  Xrays DG Chest Port 1 View  Result Date: 04/10/2020 CLINICAL DATA:  Chest tube placement.  Postoperative change right lung EXAM: PORTABLE CHEST 1 VIEW COMPARISON:  April 09, 2020 FINDINGS: Chest tube unchanged in position on the right. No appreciable pneumothorax. Postoperative change right upper lobe. Mild atelectasis right upper lobe region. No edema or airspace opacity. Heart is upper normal in size with pulmonary vascularity normal. No adenopathy. No bone lesions. IMPRESSION: Postoperative change on the right. Chest tube present on the right without appreciable pneumothorax. No edema or consolidation. Stable cardiac silhouette. Electronically Signed   By: Bretta Bang III M.D.   On: 04/10/2020 08:23   DG CHEST PORT 1 VIEW  Result Date: 04/09/2020 CLINICAL DATA:  Follow-up right-sided pneumothorax. EXAM: PORTABLE CHEST 1 VIEW COMPARISON:  Chest x-ray 04/08/2020 FINDINGS: The right basilar chest tube is stable. No definite right-sided pneumothorax. Stable surgical changes involving the right upper lobe. No pleural effusions. No pulmonary infiltrates. IMPRESSION: Stable right basilar chest tube without definite pneumothorax. Electronically Signed   By: Rudie Meyer M.D.   On: 04/09/2020 07:11    Assessment/Plan:   1 CXR remains stable 2 afeb, VSS 3 sats good on RA 4 + air leak with cough 5 cont H2O seal, may need VATS  LOS: 5 days    Rowe Clack PA-C Pager [phone] 04/10/2020 Small bubble with cough when I examined her, suspect  this will stop without intervention, but will only know for sure with time Keep tube on water seal  Viviann Spare C. Dorris Fetch, MD [facility] [phone]

## 2020-04-10 NOTE — Progress Notes (Signed)
PROGRESS NOTE    Christine Guerrero  JKD:[id number] DOB: [birth year] DOA: 04/05/2020 PCP: Bernita Raisin., MD   Brief Narrative: Christine Guerrero is an 49 y.o. female with medical history significant ofprior PTx on R, VATS and talc pleurodesis in 2018. Pt with h/o chronic bronchitis, childhood asthma, non-smoker, no known A1AT. Found to have another 50% right PTx   Assessment & Plan:   Principal Problem:   Pneumothorax on right  Spontaneous pneumothorax on the right side: Status post chest tube placement by IR on 04/06/2020.  Was placed on waterseal on 04/08/2020 but then she had some crepitus so she was placed back on suction.  Repeat x-ray shows no pneumothorax..  Management deferred to IR and CT surgery.  Anxiety/depression: Continue Zoloft and Ativan.  DVT prophylaxis: SCDs Start: 04/05/20 2149   Code Status: Full Code  Family Communication: None present at bedside.  Plan of care discussed with patient in length and he verbalized understanding and agreed with it.  Status is: Inpatient  Remains inpatient appropriate because:Inpatient level of care appropriate due to severity of illness   Dispo: The patient is from: Home              Anticipated d/c is to: Home              Anticipated d/c date is: 2 days              Patient currently is not medically stable to d/c.        Estimated body mass index is 22.28 kg/m as calculated from the following:   Height as of this encounter: 5\' 6"  (1.676 m).   Weight as of this encounter: 62.6 kg.      Nutritional status:               Consultants:   CT surgery and IR  Procedures:   Chest tube placement 04/06/2020  Antimicrobials:  Anti-infectives (From admission, onward)   None         Subjective: Seen and examined at bedside.  She has no complaints.  Objective: Vitals:   04/09/20 0807 04/09/20 1249 04/09/20 2206 04/10/20 0556  BP: (!) 118/52 121/77 110/64 126/67  Pulse: 62 60 82 (!) 52  Resp:    18 18  Temp: 98.1 F (36.7 C) 98.2 F (36.8 C) 97.6 F (36.4 C) 97.6 F (36.4 C)  TempSrc: Oral Oral Oral Oral  SpO2:   99% 99%  Weight:      Height:        Intake/Output Summary (Last 24 hours) at 04/10/2020 1036 Last data filed at 04/10/2020 0557 Gross per 24 hour  Intake --  Output 0 ml  Net 0 ml   Filed Weights   04/05/20 1544  Weight: 62.6 kg    Examination:  General exam: Appears calm and comfortable  Respiratory system: Clear to auscultation. Respiratory effort normal.  Chest tube on the right side. Cardiovascular system: S1 & S2 heard, RRR. No JVD, murmurs, rubs, gallops or clicks. No pedal edema. Gastrointestinal system: Abdomen is nondistended, soft and nontender. No organomegaly or masses felt. Normal bowel sounds heard. Central nervous system: Alert and oriented. No focal neurological deficits. Extremities: Symmetric 5 x 5 power. Skin: No rashes, lesions or ulcers.  Psychiatry: Judgement and insight appear normal. Mood & affect appropriate.   Data Reviewed: I have personally reviewed following labs and imaging studies  CBC: Recent Labs  Lab 04/05/20 1546 04/10/20 0931  WBC 5.4 4.2  NEUTROABS  --  1.6*  HGB 13.0 12.4  HCT 40.1 37.9  MCV 90.9 89.6  PLT 241 219   Basic Metabolic Panel: Recent Labs  Lab 04/05/20 1546 04/10/20 0931  NA 139 135  K 3.5 4.1  CL 104 101  CO2 25 27  GLUCOSE 83 81  BUN 14 12  CREATININE 0.75 0.83  CALCIUM 9.0 9.0  MG  --  1.9   GFR: Estimated Creatinine Clearance: 76.8 mL/min (by C-G formula based on SCr of 0.83 mg/dL). Liver Function Tests: No results for input(s): AST, ALT, ALKPHOS, BILITOT, PROT, ALBUMIN in the last 168 hours. No results for input(s): LIPASE, AMYLASE in the last 168 hours. No results for input(s): AMMONIA in the last 168 hours. Coagulation Profile: Recent Labs  Lab 04/06/20 1617  INR 1.0   Cardiac Enzymes: No results for input(s): CKTOTAL, CKMB, CKMBINDEX, TROPONINI in the last 168  hours. BNP (last 3 results) No results for input(s): PROBNP in the last 8760 hours. HbA1C: No results for input(s): HGBA1C in the last 72 hours. CBG: No results for input(s): GLUCAP in the last 168 hours. Lipid Profile: No results for input(s): CHOL, HDL, LDLCALC, TRIG, CHOLHDL, LDLDIRECT in the last 72 hours. Thyroid Function Tests: No results for input(s): TSH, T4TOTAL, FREET4, T3FREE, THYROIDAB in the last 72 hours. Anemia Panel: No results for input(s): VITAMINB12, FOLATE, FERRITIN, TIBC, IRON, RETICCTPCT in the last 72 hours. Sepsis Labs: No results for input(s): PROCALCITON, LATICACIDVEN in the last 168 hours.  Recent Results (from the past 240 hour(s))  Respiratory Panel by RT PCR (Flu A&B, Covid) - Nasopharyngeal Swab     Status: None   Collection Time: 04/05/20  7:00 PM   Specimen: Nasopharyngeal Swab  Result Value Ref Range Status   SARS Coronavirus 2 by RT PCR NEGATIVE NEGATIVE Final    Comment: (NOTE) SARS-CoV-2 target nucleic acids are NOT DETECTED.  The SARS-CoV-2 RNA is generally detectable in upper respiratoy specimens during the acute phase of infection. The lowest concentration of SARS-CoV-2 viral copies this assay can detect is 131 copies/mL. A negative result does not preclude SARS-Cov-2 infection and should not be used as the sole basis for treatment or other patient management decisions. A negative result may occur with  improper specimen collection/handling, submission of specimen other than nasopharyngeal swab, presence of viral mutation(s) within the areas targeted by this assay, and inadequate number of viral copies (<131 copies/mL). A negative result must be combined with clinical observations, patient history, and epidemiological information. The expected result is Negative.  Fact Sheet for Patients:  https://www.moore.com/  Fact Sheet for Healthcare Providers:  https://www.young.biz/  This test is no t yet  approved or cleared by the [place] FDA and  has been authorized for detection and/or diagnosis of SARS-CoV-2 by FDA under an Emergency Use Authorization (EUA). This EUA will remain  in effect (meaning this test can be used) for the duration of the COVID-19 declaration under Section 564(b)(1) of the Act, 21 U.S.C. section 360bbb-3(b)(1), unless the authorization is terminated or revoked sooner.     Influenza A by PCR NEGATIVE NEGATIVE Final   Influenza B by PCR NEGATIVE NEGATIVE Final    Comment: (NOTE) The Xpert Xpress SARS-CoV-2/FLU/RSV assay is intended as an aid in  the diagnosis of influenza from Nasopharyngeal swab specimens and  should not be used as a sole basis for treatment. Nasal washings and  aspirates are unacceptable for Xpert Xpress SARS-CoV-2/FLU/RSV  testing.  Fact Sheet for Patients: https://www.moore.com/  Fact Sheet for Healthcare  Providers: https://www.young.biz/  This test is not yet approved or cleared by the [place] and  has been authorized for detection and/or diagnosis of SARS-CoV-2 by  FDA under an Emergency Use Authorization (EUA). This EUA will remain  in effect (meaning this test can be used) for the duration of the  Covid-19 declaration under Section 564(b)(1) of the Act, 21  U.S.C. section 360bbb-3(b)(1), unless the authorization is  terminated or revoked. Performed at [facility], [address], [place], [place] [address]       Radiology Studies: DG Chest Port 1 View  Result Date: 04/10/2020 CLINICAL DATA:  Chest tube placement. Postoperative change right lung EXAM: PORTABLE CHEST 1 VIEW COMPARISON:  April 09, 2020 FINDINGS: Chest tube unchanged in position on the right. No appreciable pneumothorax. Postoperative change right upper lobe. Mild atelectasis right upper lobe region. No edema or airspace opacity. Heart is upper normal in size with pulmonary vascularity normal. No  adenopathy. No bone lesions. IMPRESSION: Postoperative change on the right. Chest tube present on the right without appreciable pneumothorax. No edema or consolidation. Stable cardiac silhouette. Electronically Signed   By: Bretta Bang III M.D.   On: 04/10/2020 08:23   DG CHEST PORT 1 VIEW  Result Date: 04/09/2020 CLINICAL DATA:  Follow-up right-sided pneumothorax. EXAM: PORTABLE CHEST 1 VIEW COMPARISON:  Chest x-ray 04/08/2020 FINDINGS: The right basilar chest tube is stable. No definite right-sided pneumothorax. Stable surgical changes involving the right upper lobe. No pleural effusions. No pulmonary infiltrates. IMPRESSION: Stable right basilar chest tube without definite pneumothorax. Electronically Signed   By: Rudie Meyer M.D.   On: 04/09/2020 07:11    Scheduled Meds: . diclofenac  75 mg Oral Q breakfast  . estradiol  1 mg Oral Daily  . ezetimibe  10 mg Oral QPM  . influenza vac split quadrivalent PF  0.5 mL Intramuscular Tomorrow-1000  . loratadine  10 mg Oral Daily  . pantoprazole  40 mg Oral Daily  . sertraline  25 mg Oral Daily   Continuous Infusions:   LOS: 5 days   Time spent: 23 minutes   Hughie Closs, MD [facility]  04/10/2020, 10:36 AM   To contact the attending provider between 7A-7P or the covering provider during after hours 7P-7A, please log into the web site [email or web address].

## 2020-04-11 ENCOUNTER — Inpatient Hospital Stay (HOSPITAL_COMMUNITY): Payer: BC Managed Care – PPO

## 2020-04-11 DIAGNOSIS — J939 Pneumothorax, unspecified: Secondary | ICD-10-CM | POA: Diagnosis not present

## 2020-04-11 NOTE — Progress Notes (Signed)
  Subjective: C/o coughing more- nonproductive  Objective: Vital signs in last 24 hours: Temp:  [97.6 F (36.4 C)-98.2 F (36.8 C)] 97.6 F (36.4 C) (11/14 0500) Pulse Rate:  [62] 62 (11/14 0500) Cardiac Rhythm: Sinus bradycardia (11/13 1919) Resp:  [16-17] 16 (11/14 0500) BP: (119-125)/(68-74) 120/72 (11/14 0500) SpO2:  [95 %] 95 % (11/14 0500)  Hemodynamic parameters for last 24 hours:    Intake/Output from previous day: No intake/output data recorded. Intake/Output this shift: No intake/output data recorded.  General appearance: alert, cooperative and no distress + small air leak  Lab Results: Recent Labs    04/10/20 0931  WBC 4.2  HGB 12.4  HCT 37.9  PLT 219   BMET:  Recent Labs    04/10/20 0931  NA 135  K 4.1  CL 101  CO2 27  GLUCOSE 81  BUN 12  CREATININE 0.83  CALCIUM 9.0    PT/INR: No results for input(s): LABPROT, INR in the last 72 hours. ABG    Component Value Date/Time   PHART 7.424 01/23/2017 0407   HCO3 25.3 01/23/2017 0407   TCO2 26 01/23/2017 0407   ACIDBASEDEF 1.8 01/21/2017 1330   O2SAT 99.0 01/23/2017 0407   CBG (last 3)  No results for input(s): GLUCAP in the last 72 hours.  Assessment/Plan: S/P  -Spontaneous pneumothorax.  Still has a small leak, intermittent about every 3rd cough Lung is up on CXR   LOS: 6 days    Christine Guerrero 04/11/2020

## 2020-04-11 NOTE — Progress Notes (Signed)
PROGRESS NOTE    Christine Guerrero  VVO:[id number] DOB: 1971/05/04 DOA: 04/05/2020 PCP: Bernita Raisin., MD   Brief Narrative: Christine Guerrero is an 49 y.o. female with medical history significant ofprior PTx on R, VATS and talc pleurodesis in 2018. Pt with h/o chronic bronchitis, childhood asthma, non-smoker, no known A1AT. Found to have another 50% right PTx   Assessment & Plan:   Principal Problem:   Pneumothorax on right  Spontaneous pneumothorax on the right side: Status post chest tube placement by IR on 04/06/2020.  Was placed on waterseal on 04/08/2020 but then she had some crepitus so she was placed back on suction.  Repeat x-ray shows no pneumothorax..  Management deferred to IR and CT surgery.  Anxiety/depression: Continue Zoloft and Ativan.  DVT prophylaxis: SCDs Start: 04/05/20 2149   Code Status: Full Code  Family Communication: None present at bedside.  Plan of care discussed with patient in length and he verbalized understanding and agreed with it.  Status is: Inpatient  Remains inpatient appropriate because:Inpatient level of care appropriate due to severity of illness   Dispo: The patient is from: Home              Anticipated d/c is to: Home              Anticipated d/c date is: 2 days              Patient currently is not medically stable to d/c.        Estimated body mass index is 22.28 kg/m as calculated from the following:   Height as of this encounter: 5\' 6"  (1.676 m).   Weight as of this encounter: 62.6 kg.      Nutritional status:               Consultants:   CT surgery and IR  Procedures:   Chest tube placement 04/06/2020  Antimicrobials:  Anti-infectives (From admission, onward)   None         Subjective: Seen and examined.  No complaints.  Objective: Vitals:   04/10/20 0556 04/10/20 1154 04/10/20 1940 04/11/20 0500  BP: 126/67 125/68 119/74 120/72  Pulse: (!) 52 62 62 62  Resp: 18 17 17 16   Temp:  97.6 F (36.4 C) 98.2 F (36.8 C) 97.6 F (36.4 C) 97.6 F (36.4 C)  TempSrc: Oral Oral Oral Oral  SpO2: 99% 95% 95% 95%  Weight:      Height:       No intake or output data in the 24 hours ending 04/11/20 1044 Filed Weights   04/05/20 1544  Weight: 62.6 kg    Examination:  General exam: Appears calm and comfortable  Respiratory system: Clear to auscultation. Respiratory effort normal.  Chest tube on the right side. Cardiovascular system: S1 & S2 heard, RRR. No JVD, murmurs, rubs, gallops or clicks. No pedal edema. Gastrointestinal system: Abdomen is nondistended, soft and nontender. No organomegaly or masses felt. Normal bowel sounds heard. Central nervous system: Alert and oriented. No focal neurological deficits. Extremities: Symmetric 5 x 5 power. Skin: No rashes, lesions or ulcers.  Psychiatry: Judgement and insight appear normal. Mood & affect appropriate.    Data Reviewed: I have personally reviewed following labs and imaging studies  CBC: Recent Labs  Lab 04/05/20 1546 04/10/20 0931  WBC 5.4 4.2  NEUTROABS  --  1.6*  HGB 13.0 12.4  HCT 40.1 37.9  MCV 90.9 89.6  PLT 241 219   Basic Metabolic Panel:  Recent Labs  Lab 04/05/20 1546 04/10/20 0931  NA 139 135  K 3.5 4.1  CL 104 101  CO2 25 27  GLUCOSE 83 81  BUN 14 12  CREATININE 0.75 0.83  CALCIUM 9.0 9.0  MG  --  1.9   GFR: Estimated Creatinine Clearance: 76.8 mL/min (by C-G formula based on SCr of 0.83 mg/dL). Liver Function Tests: No results for input(s): AST, ALT, ALKPHOS, BILITOT, PROT, ALBUMIN in the last 168 hours. No results for input(s): LIPASE, AMYLASE in the last 168 hours. No results for input(s): AMMONIA in the last 168 hours. Coagulation Profile: Recent Labs  Lab 04/06/20 1617  INR 1.0   Cardiac Enzymes: No results for input(s): CKTOTAL, CKMB, CKMBINDEX, TROPONINI in the last 168 hours. BNP (last 3 results) No results for input(s): PROBNP in the last 8760 hours. HbA1C: No  results for input(s): HGBA1C in the last 72 hours. CBG: No results for input(s): GLUCAP in the last 168 hours. Lipid Profile: No results for input(s): CHOL, HDL, LDLCALC, TRIG, CHOLHDL, LDLDIRECT in the last 72 hours. Thyroid Function Tests: No results for input(s): TSH, T4TOTAL, FREET4, T3FREE, THYROIDAB in the last 72 hours. Anemia Panel: No results for input(s): VITAMINB12, FOLATE, FERRITIN, TIBC, IRON, RETICCTPCT in the last 72 hours. Sepsis Labs: No results for input(s): PROCALCITON, LATICACIDVEN in the last 168 hours.  Recent Results (from the past 240 hour(s))  Respiratory Panel by RT PCR (Flu A&B, Covid) - Nasopharyngeal Swab     Status: None   Collection Time: 04/05/20  7:00 PM   Specimen: Nasopharyngeal Swab  Result Value Ref Range Status   SARS Coronavirus 2 by RT PCR NEGATIVE NEGATIVE Final    Comment: (NOTE) SARS-CoV-2 target nucleic acids are NOT DETECTED.  The SARS-CoV-2 RNA is generally detectable in upper respiratoy specimens during the acute phase of infection. The lowest concentration of SARS-CoV-2 viral copies this assay can detect is 131 copies/mL. A negative result does not preclude SARS-Cov-2 infection and should not be used as the sole basis for treatment or other patient management decisions. A negative result may occur with  improper specimen collection/handling, submission of specimen other than nasopharyngeal swab, presence of viral mutation(s) within the areas targeted by this assay, and inadequate number of viral copies (<131 copies/mL). A negative result must be combined with clinical observations, patient history, and epidemiological information. The expected result is Negative.  Fact Sheet for Patients:  https://www.moore.com/  Fact Sheet for Healthcare Providers:  https://www.young.biz/  This test is no t yet approved or cleared by the [place] FDA and  has been authorized for detection and/or  diagnosis of SARS-CoV-2 by FDA under an Emergency Use Authorization (EUA). This EUA will remain  in effect (meaning this test can be used) for the duration of the COVID-19 declaration under Section 564(b)(1) of the Act, 21 U.S.C. section 360bbb-3(b)(1), unless the authorization is terminated or revoked sooner.     Influenza A by PCR NEGATIVE NEGATIVE Final   Influenza B by PCR NEGATIVE NEGATIVE Final    Comment: (NOTE) The Xpert Xpress SARS-CoV-2/FLU/RSV assay is intended as an aid in  the diagnosis of influenza from Nasopharyngeal swab specimens and  should not be used as a sole basis for treatment. Nasal washings and  aspirates are unacceptable for Xpert Xpress SARS-CoV-2/FLU/RSV  testing.  Fact Sheet for Patients: https://www.moore.com/  Fact Sheet for Healthcare Providers: https://www.young.biz/  This test is not yet approved or cleared by the [place] and  has been authorized for  detection and/or diagnosis of SARS-CoV-2 by  FDA under an Emergency Use Authorization (EUA). This EUA will remain  in effect (meaning this test can be used) for the duration of the  Covid-19 declaration under Section 564(b)(1) of the Act, 21  U.S.C. section 360bbb-3(b)(1), unless the authorization is  terminated or revoked. Performed at [facility], [address] [place], [place] [address]       Radiology Studies: DG Chest Port 1 View  Result Date: 04/11/2020 CLINICAL DATA:  Pneumothorax EXAM: PORTABLE CHEST 1 VIEW COMPARISON:  April 10, 2020 FINDINGS: The cardiomediastinal silhouette is unchanged in contour.Status post RIGHT lung surgery. RIGHT chest tube in place. No pleural effusion. No significant pneumothorax; there may be a trace RIGHT basilar pneumothorax. No acute pleuroparenchymal abnormality. Visualized abdomen is unremarkable. RIGHT-sided subcutaneous air. No acute osseous abnormality. IMPRESSION: RIGHT chest tube in place. No  significant pneumothorax; there may be a trace residual RIGHT basilar pneumothorax. Electronically Signed   By: Meda Klinefelter MD   On: 04/11/2020 10:22   DG Chest Port 1 View  Result Date: 04/10/2020 CLINICAL DATA:  Chest tube placement. Postoperative change right lung EXAM: PORTABLE CHEST 1 VIEW COMPARISON:  April 09, 2020 FINDINGS: Chest tube unchanged in position on the right. No appreciable pneumothorax. Postoperative change right upper lobe. Mild atelectasis right upper lobe region. No edema or airspace opacity. Heart is upper normal in size with pulmonary vascularity normal. No adenopathy. No bone lesions. IMPRESSION: Postoperative change on the right. Chest tube present on the right without appreciable pneumothorax. No edema or consolidation. Stable cardiac silhouette. Electronically Signed   By: Bretta Bang III M.D.   On: 04/10/2020 08:23    Scheduled Meds: . diclofenac  75 mg Oral Q breakfast  . estradiol  1 mg Oral Daily  . ezetimibe  10 mg Oral QPM  . influenza vac split quadrivalent PF  0.5 mL Intramuscular Tomorrow-1000  . loratadine  10 mg Oral Daily  . pantoprazole  40 mg Oral Daily  . sertraline  25 mg Oral Daily   Continuous Infusions:   LOS: 6 days   Time spent: 20 minutes   Hughie Closs, MD [facility]  04/11/2020, 10:44 AM   To contact the attending provider between 7A-7P or the covering provider during after hours 7P-7A, please log into the web site [email or web address].

## 2020-04-12 ENCOUNTER — Inpatient Hospital Stay (HOSPITAL_COMMUNITY): Payer: BC Managed Care – PPO

## 2020-04-12 DIAGNOSIS — J939 Pneumothorax, unspecified: Secondary | ICD-10-CM | POA: Diagnosis not present

## 2020-04-12 NOTE — Progress Notes (Signed)
PROGRESS NOTE    Christine Guerrero  DXA:[id number] DOB: January 21, 1971 DOA: 04/05/2020 PCP: Bernita Raisin., MD   Brief Narrative: Christine Guerrero is an 49 y.o. female with medical history significant ofprior PTx on R, VATS and talc pleurodesis in 2018. Pt with h/o chronic bronchitis, childhood asthma, non-smoker, no known A1AT. Found to have another 50% right PTx   Assessment & Plan:   Principal Problem:   Pneumothorax on right  Spontaneous pneumothorax on the right side: Status post chest tube placement by IR on 04/06/2020.  Was placed on waterseal on 04/08/2020 but then she had some crepitus so she was placed back on suction.  Repeat x-ray showed no pneumothorax but x-ray today shows enlarging right basilar pneumothorax with chest tube in place.  She had some leak as well.  Chest tube remains on waterseal..  Management deferred to IR and CT surgery.  Anxiety/depression: Continue Zoloft and Ativan.  DVT prophylaxis: SCDs Start: 04/05/20 2149   Code Status: Full Code  Family Communication: Husband present at bedside.  Plan of care discussed with patient in length and he verbalized understanding and agreed with it.  Status is: Inpatient  Remains inpatient appropriate because:Inpatient level of care appropriate due to severity of illness   Dispo: The patient is from: Home              Anticipated d/c is to: Home              Anticipated d/c date is: 2 days              Patient currently is not medically stable to d/c.        Estimated body mass index is 22.28 kg/m as calculated from the following:   Height as of this encounter: 5\' 6"  (1.676 m).   Weight as of this encounter: 62.6 kg.      Nutritional status:               Consultants:   CT surgery and IR  Procedures:   Chest tube placement 04/06/2020  Antimicrobials:  Anti-infectives (From admission, onward)   None         Subjective: Seen and examined.  Husband at the bedside.  She has no  new complaint.  A little frustrated since she is not improving as she had expected.  Objective: Vitals:   04/10/20 1940 04/11/20 0500 04/11/20 1240 04/11/20 2112  BP: 119/74 120/72 123/80 118/84  Pulse: 62 62 64 (!) 52  Resp: 17 16 13 14   Temp: 97.6 F (36.4 C) 97.6 F (36.4 C) 98.5 F (36.9 C) 97.8 F (36.6 C)  TempSrc: Oral Oral Oral   SpO2: 95% 95% 95% 100%  Weight:      Height:        Intake/Output Summary (Last 24 hours) at 04/12/2020 1127 Last data filed at 04/12/2020 04/14/2020 Gross per 24 hour  Intake --  Output 24 ml  Net -24 ml   Filed Weights   04/05/20 1544  Weight: 62.6 kg    Examination:  General exam: Appears calm and comfortable  Respiratory system: Clear to auscultation. Respiratory effort normal.  Chest tube in place on the right side. Cardiovascular system: S1 & S2 heard, RRR. No JVD, murmurs, rubs, gallops or clicks. No pedal edema. Gastrointestinal system: Abdomen is nondistended, soft and nontender. No organomegaly or masses felt. Normal bowel sounds heard. Central nervous system: Alert and oriented. No focal neurological deficits. Extremities: Symmetric 5 x 5 power. Skin: No rashes,  lesions or ulcers.  Psychiatry: Judgement and insight appear normal. Mood & affect appropriate.    Data Reviewed: I have personally reviewed following labs and imaging studies  CBC: Recent Labs  Lab 04/05/20 1546 04/10/20 0931  WBC 5.4 4.2  NEUTROABS  --  1.6*  HGB 13.0 12.4  HCT 40.1 37.9  MCV 90.9 89.6  PLT 241 219   Basic Metabolic Panel: Recent Labs  Lab 04/05/20 1546 04/10/20 0931  NA 139 135  K 3.5 4.1  CL 104 101  CO2 25 27  GLUCOSE 83 81  BUN 14 12  CREATININE 0.75 0.83  CALCIUM 9.0 9.0  MG  --  1.9   GFR: Estimated Creatinine Clearance: 76.8 mL/min (by C-G formula based on SCr of 0.83 mg/dL). Liver Function Tests: No results for input(s): AST, ALT, ALKPHOS, BILITOT, PROT, ALBUMIN in the last 168 hours. No results for input(s): LIPASE,  AMYLASE in the last 168 hours. No results for input(s): AMMONIA in the last 168 hours. Coagulation Profile: Recent Labs  Lab 04/06/20 1617  INR 1.0   Cardiac Enzymes: No results for input(s): CKTOTAL, CKMB, CKMBINDEX, TROPONINI in the last 168 hours. BNP (last 3 results) No results for input(s): PROBNP in the last 8760 hours. HbA1C: No results for input(s): HGBA1C in the last 72 hours. CBG: No results for input(s): GLUCAP in the last 168 hours. Lipid Profile: No results for input(s): CHOL, HDL, LDLCALC, TRIG, CHOLHDL, LDLDIRECT in the last 72 hours. Thyroid Function Tests: No results for input(s): TSH, T4TOTAL, FREET4, T3FREE, THYROIDAB in the last 72 hours. Anemia Panel: No results for input(s): VITAMINB12, FOLATE, FERRITIN, TIBC, IRON, RETICCTPCT in the last 72 hours. Sepsis Labs: No results for input(s): PROCALCITON, LATICACIDVEN in the last 168 hours.  Recent Results (from the past 240 hour(s))  Respiratory Panel by RT PCR (Flu A&B, Covid) - Nasopharyngeal Swab     Status: None   Collection Time: 04/05/20  7:00 PM   Specimen: Nasopharyngeal Swab  Result Value Ref Range Status   SARS Coronavirus 2 by RT PCR NEGATIVE NEGATIVE Final    Comment: (NOTE) SARS-CoV-2 target nucleic acids are NOT DETECTED.  The SARS-CoV-2 RNA is generally detectable in upper respiratoy specimens during the acute phase of infection. The lowest concentration of SARS-CoV-2 viral copies this assay can detect is 131 copies/mL. A negative result does not preclude SARS-Cov-2 infection and should not be used as the sole basis for treatment or other patient management decisions. A negative result may occur with  improper specimen collection/handling, submission of specimen other than nasopharyngeal swab, presence of viral mutation(s) within the areas targeted by this assay, and inadequate number of viral copies (<131 copies/mL). A negative result must be combined with clinical observations, patient  history, and epidemiological information. The expected result is Negative.  Fact Sheet for Patients:  https://www.moore.com/  Fact Sheet for Healthcare Providers:  https://www.young.biz/  This test is no t yet approved or cleared by the [place] FDA and  has been authorized for detection and/or diagnosis of SARS-CoV-2 by FDA under an Emergency Use Authorization (EUA). This EUA will remain  in effect (meaning this test can be used) for the duration of the COVID-19 declaration under Section 564(b)(1) of the Act, 21 U.S.C. section 360bbb-3(b)(1), unless the authorization is terminated or revoked sooner.     Influenza A by PCR NEGATIVE NEGATIVE Final   Influenza B by PCR NEGATIVE NEGATIVE Final    Comment: (NOTE) The Xpert Xpress SARS-CoV-2/FLU/RSV assay is intended as an  aid in  the diagnosis of influenza from Nasopharyngeal swab specimens and  should not be used as a sole basis for treatment. Nasal washings and  aspirates are unacceptable for Xpert Xpress SARS-CoV-2/FLU/RSV  testing.  Fact Sheet for Patients: https://www.moore.com/  Fact Sheet for Healthcare Providers: https://www.young.biz/  This test is not yet approved or cleared by the [place] FDA and  has been authorized for detection and/or diagnosis of SARS-CoV-2 by  FDA under an Emergency Use Authorization (EUA). This EUA will remain  in effect (meaning this test can be used) for the duration of the  Covid-19 declaration under Section 564(b)(1) of the Act, 21  U.S.C. section 360bbb-3(b)(1), unless the authorization is  terminated or revoked. Performed at [facility], [address], [place], [place] [address]       Radiology Studies: DG Chest Port 1 View  Result Date: 04/12/2020 CLINICAL DATA:  Spontaneous pneumothorax EXAM: PORTABLE CHEST 1 VIEW COMPARISON:  04/11/2020 FINDINGS: The lungs appear mildly hyperinflated,  unchanged from prior examination. Right basilar pigtail chest tube is unchanged in position. Small right basilar pneumothorax appears to have slightly enlarged in the interval since prior examination. No significant mediastinal shift or asymmetric depression of the right hemidiaphragm to suggest tension physiology. Surgical staple line noted at the right apex. No superimposed focal pulmonary infiltrate. No pneumothorax or pleural effusion on the left. Cardiac size within normal limits. IMPRESSION: Small but enlarging right basilar pneumothorax. Right pigtail chest tube in place. Stable pulmonary hyperinflation. Electronically Signed   By: Helyn Numbers MD   On: 04/12/2020 06:45   DG Chest Port 1 View  Result Date: 04/11/2020 CLINICAL DATA:  Pneumothorax EXAM: PORTABLE CHEST 1 VIEW COMPARISON:  April 10, 2020 FINDINGS: The cardiomediastinal silhouette is unchanged in contour.Status post RIGHT lung surgery. RIGHT chest tube in place. No pleural effusion. No significant pneumothorax; there may be a trace RIGHT basilar pneumothorax. No acute pleuroparenchymal abnormality. Visualized abdomen is unremarkable. RIGHT-sided subcutaneous air. No acute osseous abnormality. IMPRESSION: RIGHT chest tube in place. No significant pneumothorax; there may be a trace residual RIGHT basilar pneumothorax. Electronically Signed   By: Meda Klinefelter MD   On: 04/11/2020 10:22    Scheduled Meds: . diclofenac  75 mg Oral Q breakfast  . estradiol  1 mg Oral Daily  . ezetimibe  10 mg Oral QPM  . influenza vac split quadrivalent PF  0.5 mL Intramuscular Tomorrow-1000  . loratadine  10 mg Oral Daily  . pantoprazole  40 mg Oral Daily  . sertraline  25 mg Oral Daily   Continuous Infusions:   LOS: 7 days   Time spent: 22 minutes   Hughie Closs, MD [facility]  04/12/2020, 11:27 AM   To contact the attending provider between 7A-7P or the covering provider during after hours 7P-7A, please log into the web  site [email or web address].

## 2020-04-12 NOTE — Progress Notes (Addendum)
      [address]       [place] [address]             [phone]           Subjective: Patient without specific complaint this am.  Objective: Vital signs in last 24 hours: Temp:  [97.8 F (36.6 C)-98.5 F (36.9 C)] 97.8 F (36.6 C) (11/14 2112) Pulse Rate:  [52-64] 52 (11/14 2112) Cardiac Rhythm: Sinus bradycardia (11/15 0703) Resp:  [13-14] 14 (11/14 2112) BP: (118-123)/(80-84) 118/84 (11/14 2112) SpO2:  [95 %-100 %] 100 % (11/14 2112)    Intake/Output from previous day: 11/14 0701 - 11/15 0700 In: -  Out: 24 [Chest Tube:24]   Physical Exam:  Cardiovascular: RRR Pulmonary: Clear to auscultation bilaterally Wounds: Dressing is clean and dry.  Chest Tube: to water seal; small, intermittent air leak with cough  Lab Results: ACZ:YSAYTK Labs    04/10/20 0931  WBC 4.2  HGB 12.4  HCT 37.9  PLT 219   BMET:  Recent Labs    04/10/20 0931  NA 135  K 4.1  CL 101  CO2 27  GLUCOSE 81  BUN 12  CREATININE 0.83  CALCIUM 9.0    PT/INR: No results for input(s): LABPROT, INR in the last 72 hours. ABG:  INR: Will add last result for INR, ABG once components are confirmed Will add last 4 CBG results once components are confirmed  Assessment/Plan:  1. CV -SR 2.  Pulmonary - S/p right 52 French pigtail chest tube 11/09. Right pigtail chest tube with 24 cc of output last 24 hours. Chest tube is to water seal and there is a very small, intermittent air leak with cough. CXR this am shows small but ? Increased  right basilar pneumothorax. As discussed with Dr. Donata Clay, chest tube to remain to water seal for now. Check CXR in am.  Lelon Huh [facility] 04/12/2020,7:28 AM [phone] With very minimal air leak and very minimal pneumothorax on chest x-ray we will continue current conservative course of chest tube drainage to waterseal and follow   PVantrigt MD

## 2020-04-12 NOTE — Plan of Care (Signed)
  Problem: Health Behavior/Discharge Planning: Goal: Ability to manage health-related needs will improve Outcome: Progressing   Problem: Education: Goal: Knowledge of General Education information will improve Description: Including pain rating scale, medication(s)/side effects and non-pharmacologic comfort measures Outcome: Progressing   Problem: Clinical Measurements: Goal: Ability to maintain clinical measurements within normal limits will improve Outcome: Progressing   Problem: Coping: Goal: Level of anxiety will decrease Outcome: Progressing   Problem: Pain Managment: Goal: General experience of comfort will improve Outcome: Progressing   Problem: Safety: Goal: Ability to remain free from injury will improve Outcome: Progressing

## 2020-04-12 NOTE — Progress Notes (Signed)
Referring Physician(s): Hillary Bow [facility])  Supervising Physician: Marliss Coots  Patient Status:  [facility] - In-pt  Chief Complaint: "I want to speak to my surgeon"  Subjective:  History of recurrent right spontaneous PTX s/p right chest tube placement in IR 04/06/2020. Patient awake and alert sitting in chair. Husband at bedside. Seems frustrated this AM- requesting to speak to Dr. Donata Clay regarding surgery. Right chest tube site c/d/i. On RA.  CXR this AM: 1. Small but enlarging right basilar pneumothorax. Right pigtail chest tube in place. 2. Stable pulmonary hyperinflation.   Allergies: Codeine  Medications: Prior to Admission medications   Medication Sig Start Date End Date Taking? Authorizing Provider  diclofenac (CATAFLAM) 50 MG tablet Take 50 mg by mouth daily.    Yes [provider]  estradiol (ESTRACE) 1 MG tablet Take 1 tablet by mouth daily.   Yes [provider]  ezetimibe (ZETIA) 10 MG tablet Take 10 mg by mouth every evening.   Yes [provider]  fexofenadine (ALLEGRA) 180 MG tablet Take 1 tablet (180 mg total) by mouth daily. 02/08/16  Yes Padgett, Pilar Grammes, MD  LORazepam (ATIVAN) 0.5 MG tablet Take 0.5 mg by mouth daily as needed for anxiety. 01/15/20  Yes [provider]  omega-3 acid ethyl esters (LOVAZA) 1 g capsule Take 1 g by mouth 2 (two) times daily.   Yes [provider]  omeprazole (PRILOSEC) 20 MG capsule Take 20 mg by mouth in the morning. 03/17/20  Yes [provider]  sertraline (ZOLOFT) 25 MG tablet Take 25 mg by mouth daily. 01/28/20  Yes [provider]     Vital Signs: BP 118/84 (BP Location: Left Arm)   Pulse (!) 52   Temp 97.8 F (36.6 C)   Resp 14   Ht 5\' 6"  (1.676 m)   Wt 138 lb 0.1 oz (62.6 kg)   SpO2 100%   BMI 22.28 kg/m   Physical Exam Vitals and nursing note reviewed.  Constitutional:      General: She is not in acute distress.    Appearance:  Normal appearance.  Pulmonary:     Effort: Pulmonary effort is normal. No respiratory distress.     Comments: On RA. Right chest tube site without tenderness, erythema, drainage, or active bleeding; approximately 120 cc output of serous fluid in pleure-vac; tube to water seal with (+) air leak. Skin:    General: Skin is warm and dry.  Neurological:     Mental Status: She is alert.     Imaging: DG Chest Port 1 View  Result Date: 04/12/2020 CLINICAL DATA:  Spontaneous pneumothorax EXAM: PORTABLE CHEST 1 VIEW COMPARISON:  04/11/2020 FINDINGS: The lungs appear mildly hyperinflated, unchanged from prior examination. Right basilar pigtail chest tube is unchanged in position. Small right basilar pneumothorax appears to have slightly enlarged in the interval since prior examination. No significant mediastinal shift or asymmetric depression of the right hemidiaphragm to suggest tension physiology. Surgical staple line noted at the right apex. No superimposed focal pulmonary infiltrate. No pneumothorax or pleural effusion on the left. Cardiac size within normal limits. IMPRESSION: Small but enlarging right basilar pneumothorax. Right pigtail chest tube in place. Stable pulmonary hyperinflation. Electronically Signed   By: 04/13/2020 MD   On: 04/12/2020 06:45   DG Chest Port 1 View  Result Date: 04/11/2020 CLINICAL DATA:  Pneumothorax EXAM: PORTABLE CHEST 1 VIEW COMPARISON:  April 10, 2020 FINDINGS: The cardiomediastinal silhouette is unchanged in contour.Status post  RIGHT lung surgery. RIGHT chest tube in place. No pleural effusion. No significant pneumothorax; there may be a trace RIGHT basilar pneumothorax. No acute pleuroparenchymal abnormality. Visualized abdomen is unremarkable. RIGHT-sided subcutaneous air. No acute osseous abnormality. IMPRESSION: RIGHT chest tube in place. No significant pneumothorax; there may be a trace residual RIGHT basilar pneumothorax. Electronically Signed   By:  Meda Klinefelter MD   On: 04/11/2020 10:22   DG Chest Port 1 View  Result Date: 04/10/2020 CLINICAL DATA:  Chest tube placement. Postoperative change right lung EXAM: PORTABLE CHEST 1 VIEW COMPARISON:  April 09, 2020 FINDINGS: Chest tube unchanged in position on the right. No appreciable pneumothorax. Postoperative change right upper lobe. Mild atelectasis right upper lobe region. No edema or airspace opacity. Heart is upper normal in size with pulmonary vascularity normal. No adenopathy. No bone lesions. IMPRESSION: Postoperative change on the right. Chest tube present on the right without appreciable pneumothorax. No edema or consolidation. Stable cardiac silhouette. Electronically Signed   By: Bretta Bang III M.D.   On: 04/10/2020 08:23   DG CHEST PORT 1 VIEW  Result Date: 04/09/2020 CLINICAL DATA:  Follow-up right-sided pneumothorax. EXAM: PORTABLE CHEST 1 VIEW COMPARISON:  Chest x-ray 04/08/2020 FINDINGS: The right basilar chest tube is stable. No definite right-sided pneumothorax. Stable surgical changes involving the right upper lobe. No pleural effusions. No pulmonary infiltrates. IMPRESSION: Stable right basilar chest tube without definite pneumothorax. Electronically Signed   By: Rudie Meyer M.D.   On: 04/09/2020 07:11    Labs:  CBC: Recent Labs    04/05/20 1546 04/10/20 0931  WBC 5.4 4.2  HGB 13.0 12.4  HCT 40.1 37.9  PLT 241 219    COAGS: Recent Labs    04/06/20 1617  INR 1.0    BMP: Recent Labs    04/05/20 1546 04/10/20 0931  NA 139 135  K 3.5 4.1  CL 104 101  CO2 25 27  GLUCOSE 83 81  BUN 14 12  CALCIUM 9.0 9.0  CREATININE 0.75 0.83  GFRNONAA >60 >60     Assessment and Plan:  History of recurrent right spontaneous PTX s/p right chest tube placement in IR 04/06/2020. Right chest tube stable with approximately 120 cc output of serous fluid in pleure-vac, (+) air leak. Continue current drain management- tube to be kept to water seal,  continue with QD CXR, further management per TCTS. Further plans per TRH/TCTS- appreciate and agree with management. IR to follow.   Electronically Signed: Elwin Mocha, PA-C 04/12/2020, 10:39 AM   I spent a total of 25 Minutes at the the patient's bedside AND on the patient's hospital floor or unit, greater than 50% of which was counseling/coordinating care for right spontaneous PTX s/p right chest tube placement.

## 2020-04-12 NOTE — Plan of Care (Signed)
  Problem: Education: Goal: Knowledge of General Education information will improve Description: Including pain rating scale, medication(s)/side effects and non-pharmacologic comfort measures Outcome: Progressing   Problem: Health Behavior/Discharge Planning: Goal: Ability to manage health-related needs will improve Outcome: Progressing   Problem: Clinical Measurements: Goal: Ability to maintain clinical measurements within normal limits will improve Outcome: Progressing   Problem: Clinical Measurements: Goal: Will remain free from infection Outcome: Progressing   Problem: Clinical Measurements: Goal: Respiratory complications will improve Outcome: Progressing   Problem: Activity: Goal: Risk for activity intolerance will decrease Outcome: Progressing   Problem: Safety: Goal: Ability to remain free from injury will improve Outcome: Progressing

## 2020-04-13 ENCOUNTER — Inpatient Hospital Stay (HOSPITAL_COMMUNITY): Payer: BC Managed Care – PPO

## 2020-04-13 DIAGNOSIS — J939 Pneumothorax, unspecified: Secondary | ICD-10-CM | POA: Diagnosis not present

## 2020-04-13 MED ORDER — MUPIROCIN 2 % EX OINT
1.0000 "application " | TOPICAL_OINTMENT | Freq: Two times a day (BID) | CUTANEOUS | Status: DC
  Administered 2020-04-13 – 2020-04-15 (×3): 1 via NASAL
  Filled 2020-04-13 (×3): qty 22

## 2020-04-13 NOTE — Plan of Care (Signed)
°  Problem: Education: Goal: Knowledge of General Education information will improve Description: Including pain rating scale, medication(s)/side effects and non-pharmacologic comfort measures Outcome: Progressing   Problem: Clinical Measurements: Goal: Respiratory complications will improve Outcome: Progressing   Problem: Clinical Measurements: Goal: Cardiovascular complication will be avoided Outcome: Progressing   Problem: Activity: Goal: Risk for activity intolerance will decrease Outcome: Progressing   Problem: Safety: Goal: Ability to remain free from injury will improve Outcome: Progressing   Problem: Skin Integrity: Goal: Risk for impaired skin integrity will decrease Outcome: Progressing

## 2020-04-13 NOTE — Progress Notes (Signed)
      [address]       [place] [address]             [phone]    Patient admitted last week with recurrent right pneumothorax, chest tube is been placed.  I was asked today by Dr. Maren Beach to see the patient in consideration for robotic assisted repair of persistent air leak and possible pleurectomy.  In 2018 the patient had a spontaneous pneumothorax on the right video-assisted thorascopic stapling of blebs was done at that time.  This admission the patient is having chest pain since last week this morning the tube was clamped however this afternoon with unclamping the tube the patient had a large airleak, Dr. Maren Beach discussed with the patient repeat surgery as is likely that pneumothorax will re occur if something more is not done.  I discussed with the patient proceeding with bronchoscopy right robotic assisted thoracoscopy to identify the air leak and repair and consideration of pleurectomy.  Risks and options of surgery were discussed with the patient.  We will make the patient n.p.o. tonight for possible surgery tomorrow if you can be accommodated in the OR.  The patient had her questions answered and would like to proceed as soon as possible.

## 2020-04-13 NOTE — Plan of Care (Signed)
Chest tube remains clamped. Denies SOB at this time.Medicated x 1 this shift for c/o chest discomfort.Ambulating in hallway without difficulty. Safety precautions maintained.

## 2020-04-13 NOTE — Progress Notes (Signed)
      [address]       [place] [address]             [phone]           Subjective: Patient just used the restroom and is about to eat breakfast. She hopes chest tube is removed soon.  Objective: Vital signs in last 24 hours: Temp:  [97.4 F (36.3 C)-98 F (36.7 C)] 98 F (36.7 C) (11/16 0626) Pulse Rate:  [48-65] 62 (11/16 0626) Cardiac Rhythm: Sinus bradycardia (11/16 0702) Resp:  [16-20] 16 (11/15 2216) BP: (117-129)/(62-79) 129/79 (11/15 2216) SpO2:  [99 %-100 %] 100 % (11/16 0626)    Intake/Output from previous day: No intake/output data recorded.   Physical Exam:  Cardiovascular: RRR Pulmonary: Clear to auscultation bilaterally Wounds: Dressing is clean and dry.  Chest Tube: to water seal; small, very intermittent air leak with cough  Lab Results: CBC: Recent Labs    04/10/20 0931  WBC 4.2  HGB 12.4  HCT 37.9  PLT 219   BMET:  Recent Labs    04/10/20 0931  NA 135  K 4.1  CL 101  CO2 27  GLUCOSE 81  BUN 12  CREATININE 0.83  CALCIUM 9.0    PT/INR: No results for input(s): LABPROT, INR in the last 72 hours. ABG:  INR: Will add last result for INR, ABG once components are confirmed Will add last 4 CBG results once components are confirmed  Assessment/Plan:  1. CV -SR 2.  Pulmonary - S/p right 81 French pigtail chest tube 11/09. Right pigtail chest tube with scant output last 24 hours. Chest tube is to water seal and there is a very small, intermittent air leak with cough. CXR this am shows appears to not show a  pneumothorax. I clamped right pigtail chest tube this am. Check CXR in am.  Kayln Garceau M ZimmermanPA-C 04/13/2020,7:15 AM [phone]

## 2020-04-13 NOTE — Progress Notes (Signed)
PROGRESS NOTE    Christine Guerrero  LZJ:[id number] DOB: November 25, 1970 DOA: 04/05/2020 PCP: Christine Guerrero., MD   Brief Narrative: Christine Guerrero is an 49 y.o. female with medical history significant ofprior PTx on R, VATS and talc pleurodesis in 2018. Pt with h/o chronic bronchitis, childhood asthma, non-smoker, no known A1AT. Found to have another 50% right PTx   Assessment & Plan:   Principal Problem:   Pneumothorax on right  Spontaneous pneumothorax on the right side: Status post chest tube placement by IR on 04/06/2020.  Was placed on waterseal on 04/08/2020 but then she had some crepitus so she was placed back on suction.  Initially repeat x-ray showed no pneumothorax but follow-up x-ray shows enlarging right basilar pneumothorax with chest tube in place.  X-ray today again with no pneumothorax.  Chest tube clamped.  Management deferred to IR and CT surgery.  Anxiety/depression: Continue Zoloft and Ativan.  DVT prophylaxis: SCDs Start: 04/05/20 2149   Code Status: Full Code  Family Communication: None at bedside.  Plan of care discussed with patient in length and he verbalized understanding and agreed with it.  Status is: Inpatient  Remains inpatient appropriate because:Inpatient level of care appropriate due to severity of illness   Dispo: The patient is from: Home              Anticipated d/c is to: Home              Anticipated d/c date is: 2 days              Patient currently is not medically stable to d/c.        Estimated body mass index is 22.28 kg/m as calculated from the following:   Height as of this encounter: 5\' 6"  (1.676 m).   Weight as of this encounter: 62.6 kg.      Nutritional status:               Consultants:   CT surgery and IR  Procedures:   Chest tube placement 04/06/2020  Antimicrobials:  Anti-infectives (From admission, onward)   None         Subjective: Seen and examined.  She has no  complaints.  Objective: Vitals:   04/11/20 2112 04/12/20 1536 04/12/20 2216 04/13/20 0626  BP: 118/84 117/62 129/79   Pulse: (!) 52 65 (!) 48 62  Resp: 14 20 16    Temp: 97.8 F (36.6 C) 98 F (36.7 C) (!) 97.4 F (36.3 C) 98 F (36.7 C)  TempSrc:  Oral  Oral  SpO2: 100% 99% 100% 100%  Weight:      Height:       No intake or output data in the 24 hours ending 04/13/20 1108 Filed Weights   04/05/20 1544  Weight: 62.6 kg    Examination:  General exam: Appears calm and comfortable  Respiratory system: Clear to auscultation. Respiratory effort normal.  Chest tube on right side. Cardiovascular system: S1 & S2 heard, RRR. No JVD, murmurs, rubs, gallops or clicks. No pedal edema. Gastrointestinal system: Abdomen is nondistended, soft and nontender. No organomegaly or masses felt. Normal bowel sounds heard. Central nervous system: Alert and oriented. No focal neurological deficits. Extremities: Symmetric 5 x 5 power. Skin: No rashes, lesions or ulcers.  Psychiatry: Judgement and insight appear normal. Mood & affect appropriate.     Data Reviewed: I have personally reviewed following labs and imaging studies  CBC: Recent Labs  Lab 04/10/20 0931  WBC 4.2  NEUTROABS  1.6*  HGB 12.4  HCT 37.9  MCV 89.6  PLT 219   Basic Metabolic Panel: Recent Labs  Lab 04/10/20 0931  NA 135  K 4.1  CL 101  CO2 27  GLUCOSE 81  BUN 12  CREATININE 0.83  CALCIUM 9.0  MG 1.9   GFR: Estimated Creatinine Clearance: 76.8 mL/min (by C-G formula based on SCr of 0.83 mg/dL). Liver Function Tests: No results for input(s): AST, ALT, ALKPHOS, BILITOT, PROT, ALBUMIN in the last 168 hours. No results for input(s): LIPASE, AMYLASE in the last 168 hours. No results for input(s): AMMONIA in the last 168 hours. Coagulation Profile: Recent Labs  Lab 04/06/20 1617  INR 1.0   Cardiac Enzymes: No results for input(s): CKTOTAL, CKMB, CKMBINDEX, TROPONINI in the last 168 hours. BNP (last 3  results) No results for input(s): PROBNP in the last 8760 hours. HbA1C: No results for input(s): HGBA1C in the last 72 hours. CBG: No results for input(s): GLUCAP in the last 168 hours. Lipid Profile: No results for input(s): CHOL, HDL, LDLCALC, TRIG, CHOLHDL, LDLDIRECT in the last 72 hours. Thyroid Function Tests: No results for input(s): TSH, T4TOTAL, FREET4, T3FREE, THYROIDAB in the last 72 hours. Anemia Panel: No results for input(s): VITAMINB12, FOLATE, FERRITIN, TIBC, IRON, RETICCTPCT in the last 72 hours. Sepsis Labs: No results for input(s): PROCALCITON, LATICACIDVEN in the last 168 hours.  Recent Results (from the past 240 hour(s))  Respiratory Panel by RT PCR (Flu A&B, Covid) - Nasopharyngeal Swab     Status: None   Collection Time: 04/05/20  7:00 PM   Specimen: Nasopharyngeal Swab  Result Value Ref Range Status   SARS Coronavirus 2 by RT PCR NEGATIVE NEGATIVE Final    Comment: (NOTE) SARS-CoV-2 target nucleic acids are NOT DETECTED.  The SARS-CoV-2 RNA is generally detectable in upper respiratoy specimens during the acute phase of infection. The lowest concentration of SARS-CoV-2 viral copies this assay can detect is 131 copies/mL. A negative result does not preclude SARS-Cov-2 infection and should not be used as the sole basis for treatment or other patient management decisions. A negative result may occur with  improper specimen collection/handling, submission of specimen other than nasopharyngeal swab, presence of viral mutation(s) within the areas targeted by this assay, and inadequate number of viral copies (<131 copies/mL). A negative result must be combined with clinical observations, patient history, and epidemiological information. The expected result is Negative.  Fact Sheet for Patients:  https://www.moore.com/  Fact Sheet for Healthcare Providers:  https://www.young.biz/  This test is no t yet approved or cleared  by the [place] FDA and  has been authorized for detection and/or diagnosis of SARS-CoV-2 by FDA under an Emergency Use Authorization (EUA). This EUA will remain  in effect (meaning this test can be used) for the duration of the COVID-19 declaration under Section 564(b)(1) of the Act, 21 U.S.C. section 360bbb-3(b)(1), unless the authorization is terminated or revoked sooner.     Influenza A by PCR NEGATIVE NEGATIVE Final   Influenza B by PCR NEGATIVE NEGATIVE Final    Comment: (NOTE) The Xpert Xpress SARS-CoV-2/FLU/RSV assay is intended as an aid in  the diagnosis of influenza from Nasopharyngeal swab specimens and  should not be used as a sole basis for treatment. Nasal washings and  aspirates are unacceptable for Xpert Xpress SARS-CoV-2/FLU/RSV  testing.  Fact Sheet for Patients: https://www.moore.com/  Fact Sheet for Healthcare Providers: https://www.young.biz/  This test is not yet approved or cleared by the [place] and  has been authorized for detection and/or diagnosis of SARS-CoV-2 by  FDA under an Emergency Use Authorization (EUA). This EUA will remain  in effect (meaning this test can be used) for the duration of the  Covid-19 declaration under Section 564(b)(1) of the Act, 21  U.S.C. section 360bbb-3(b)(1), unless the authorization is  terminated or revoked. Performed at [facility], [address], [place], [place] [address]       Radiology Studies: DG CHEST PORT 1 VIEW  Result Date: 04/13/2020 CLINICAL DATA:  Pneumothorax.  Chest tube. EXAM: PORTABLE CHEST 1 VIEW COMPARISON:  04/12/2020. FINDINGS: Right chest tube in stable position. No definite pneumothorax noted on today's exam. Mediastinum and hilar structures normal. Postsurgical changes right upper lung. Lungs are clear. No pleural effusion. Heart size normal. Bilateral breast implants. Mild thoracic spine scoliosis. IMPRESSION: Right chest tube in  stable position. No definite pneumothorax noted on today's exam. Postsurgical changes right upper lung. Electronically Signed   By: Maisie Fus  Register   On: 04/13/2020 06:40   DG Chest Port 1 View  Result Date: 04/12/2020 CLINICAL DATA:  Spontaneous pneumothorax EXAM: PORTABLE CHEST 1 VIEW COMPARISON:  04/11/2020 FINDINGS: The lungs appear mildly hyperinflated, unchanged from prior examination. Right basilar pigtail chest tube is unchanged in position. Small right basilar pneumothorax appears to have slightly enlarged in the interval since prior examination. No significant mediastinal shift or asymmetric depression of the right hemidiaphragm to suggest tension physiology. Surgical staple line noted at the right apex. No superimposed focal pulmonary infiltrate. No pneumothorax or pleural effusion on the left. Cardiac size within normal limits. IMPRESSION: Small but enlarging right basilar pneumothorax. Right pigtail chest tube in place. Stable pulmonary hyperinflation. Electronically Signed   By: Helyn Numbers MD   On: 04/12/2020 06:45    Scheduled Meds: . diclofenac  75 mg Oral Q breakfast  . estradiol  1 mg Oral Daily  . ezetimibe  10 mg Oral QPM  . influenza vac split quadrivalent PF  0.5 mL Intramuscular Tomorrow-1000  . loratadine  10 mg Oral Daily  . pantoprazole  40 mg Oral Daily  . sertraline  25 mg Oral Daily   Continuous Infusions:   LOS: 8 days   Time spent: 21 minutes   Hughie Closs, MD [facility]  04/13/2020, 11:08 AM   To contact the attending provider between 7A-7P or the covering provider during after hours 7P-7A, please log into the web site [email or web address].

## 2020-04-14 ENCOUNTER — Inpatient Hospital Stay (HOSPITAL_COMMUNITY): Payer: BC Managed Care – PPO

## 2020-04-14 ENCOUNTER — Inpatient Hospital Stay (HOSPITAL_COMMUNITY): Payer: BC Managed Care – PPO | Admitting: Certified Registered Nurse Anesthetist

## 2020-04-14 ENCOUNTER — Encounter (HOSPITAL_COMMUNITY): Payer: Self-pay | Admitting: Internal Medicine

## 2020-04-14 ENCOUNTER — Encounter: Payer: BC Managed Care – PPO | Admitting: Cardiothoracic Surgery

## 2020-04-14 ENCOUNTER — Encounter (HOSPITAL_COMMUNITY)
Admission: EM | Disposition: A | Discharge status: Home / Self Care | Payer: Self-pay | Source: Home / Self Care | Attending: Family Medicine

## 2020-04-14 DIAGNOSIS — J9311 Primary spontaneous pneumothorax: Secondary | ICD-10-CM

## 2020-04-14 DIAGNOSIS — J939 Pneumothorax, unspecified: Secondary | ICD-10-CM | POA: Diagnosis not present

## 2020-04-14 HISTORY — PX: INTERCOSTAL NERVE BLOCK: SHX5021

## 2020-04-14 HISTORY — PX: VIDEO BRONCHOSCOPY: SHX5072

## 2020-04-14 LAB — CBC WITH DIFFERENTIAL/PLATELET
Abs Immature Granulocytes: 0.04 10*3/uL (ref 0.00–0.07)
Basophils Absolute: 0 10*3/uL (ref 0.0–0.1)
Basophils Relative: 0 %
Eosinophils Absolute: 0 10*3/uL (ref 0.0–0.5)
Eosinophils Relative: 0 %
HCT: 37.2 % (ref 36.0–46.0)
Hemoglobin: 12.3 g/dL (ref 12.0–15.0)
Immature Granulocytes: 0 %
Lymphocytes Relative: 8 %
Lymphs Abs: 0.8 10*3/uL (ref 0.7–4.0)
MCH: 29.6 pg (ref 26.0–34.0)
MCHC: 33.1 g/dL (ref 30.0–36.0)
MCV: 89.6 fL (ref 80.0–100.0)
Monocytes Absolute: 0.1 10*3/uL (ref 0.1–1.0)
Monocytes Relative: 1 %
Neutro Abs: 8.6 10*3/uL — ABNORMAL HIGH (ref 1.7–7.7)
Neutrophils Relative %: 91 %
Platelets: 207 10*3/uL (ref 150–400)
RBC: 4.15 MIL/uL (ref 3.87–5.11)
RDW: 13.2 % (ref 11.5–15.5)
WBC: 9.5 10*3/uL (ref 4.0–10.5)
nRBC: 0 % (ref 0.0–0.2)

## 2020-04-14 LAB — CBC
HCT: 38.2 % (ref 36.0–46.0)
Hemoglobin: 12.5 g/dL (ref 12.0–15.0)
MCH: 29.3 pg (ref 26.0–34.0)
MCHC: 32.7 g/dL (ref 30.0–36.0)
MCV: 89.5 fL (ref 80.0–100.0)
Platelets: 225 10*3/uL (ref 150–400)
RBC: 4.27 MIL/uL (ref 3.87–5.11)
RDW: 13.3 % (ref 11.5–15.5)
WBC: 4.3 10*3/uL (ref 4.0–10.5)
nRBC: 0 % (ref 0.0–0.2)

## 2020-04-14 LAB — COMPREHENSIVE METABOLIC PANEL
ALT: 32 U/L (ref 0–44)
AST: 24 U/L (ref 15–41)
Albumin: 3.6 g/dL (ref 3.5–5.0)
Alkaline Phosphatase: 45 U/L (ref 38–126)
Anion gap: 8 (ref 5–15)
BUN: 15 mg/dL (ref 6–20)
CO2: 26 mmol/L (ref 22–32)
Calcium: 8.9 mg/dL (ref 8.9–10.3)
Chloride: 104 mmol/L (ref 98–111)
Creatinine, Ser: 0.75 mg/dL (ref 0.44–1.00)
GFR, Estimated: >60 mL/min (ref >60)
Glucose, Bld: 91 mg/dL (ref 70–99)
Potassium: 3.9 mmol/L (ref 3.5–5.1)
Sodium: 138 mmol/L (ref 135–145)
Total Bilirubin: 0.4 mg/dL (ref 0.3–1.2)
Total Protein: 6 g/dL — ABNORMAL LOW (ref 6.5–8.1)

## 2020-04-14 LAB — SURGICAL PCR SCREEN
MRSA, PCR: NEGATIVE
Staphylococcus aureus: NEGATIVE

## 2020-04-14 LAB — BLOOD GAS, ARTERIAL
Acid-Base Excess: 0.2 mmol/L (ref 0.0–2.0)
Bicarbonate: 24 mmol/L (ref 20.0–28.0)
Drawn by: 5269
FIO2: 21
O2 Saturation: 99.3 %
Patient temperature: 36.7
pCO2 arterial: 35.6 mmHg (ref 32.0–48.0)
pH, Arterial: 7.442 (ref 7.350–7.450)
pO2, Arterial: 168 mmHg — ABNORMAL HIGH (ref 83.0–108.0)

## 2020-04-14 LAB — TYPE AND SCREEN
ABO/RH(D): A POS
Antibody Screen: NEGATIVE

## 2020-04-14 LAB — PROTIME-INR
INR: 1 (ref 0.8–1.2)
Prothrombin Time: 12.9 seconds (ref 11.4–15.2)

## 2020-04-14 LAB — APTT: aPTT: 27 seconds (ref 24–36)

## 2020-04-14 LAB — GLUCOSE, CAPILLARY: Glucose-Capillary: 92 mg/dL (ref 70–99)

## 2020-04-14 SURGERY — THORACOSCOPY, ROBOT-ASSISTED
Anesthesia: General | Site: Chest | Laterality: Right

## 2020-04-14 MED ORDER — HYDROMORPHONE HCL 1 MG/ML IJ SOLN: 0.2500 mg | INTRAMUSCULAR | Status: DC | PRN

## 2020-04-14 MED ORDER — ROCURONIUM BROMIDE 10 MG/ML (PF) SYRINGE
PREFILLED_SYRINGE | INTRAVENOUS | Status: DC | PRN
  Administered 2020-04-14: 20 mg via INTRAVENOUS
  Administered 2020-04-14: 10 mg via INTRAVENOUS
  Administered 2020-04-14: 50 mg via INTRAVENOUS
  Administered 2020-04-14: 20 mg via INTRAVENOUS

## 2020-04-14 MED ORDER — PROMETHAZINE HCL 25 MG/ML IJ SOLN: 6.2500 mg | INTRAMUSCULAR | Status: DC | PRN

## 2020-04-14 MED ORDER — 0.9 % SODIUM CHLORIDE (POUR BTL) OPTIME
TOPICAL | Status: DC | PRN
  Administered 2020-04-14: 2000 mL

## 2020-04-14 MED ORDER — ONDANSETRON HCL 4 MG/2ML IJ SOLN
INTRAMUSCULAR | Status: DC | PRN
  Administered 2020-04-14: 4 mg via INTRAVENOUS

## 2020-04-14 MED ORDER — BUPIVACAINE HCL (PF) 0.5 % IJ SOLN
INTRAMUSCULAR | Status: AC
  Filled 2020-04-14: qty 30

## 2020-04-14 MED ORDER — DEXAMETHASONE SODIUM PHOSPHATE 10 MG/ML IJ SOLN
INTRAMUSCULAR | Status: AC
  Filled 2020-04-14: qty 1

## 2020-04-14 MED ORDER — FENTANYL CITRATE (PF) 250 MCG/5ML IJ SOLN
INTRAMUSCULAR | Status: AC
  Filled 2020-04-14: qty 5

## 2020-04-14 MED ORDER — SODIUM CHLORIDE 0.9 % IR SOLN
Status: DC | PRN
  Administered 2020-04-14: 1000 mL

## 2020-04-14 MED ORDER — PROPOFOL 10 MG/ML IV BOLUS
INTRAVENOUS | Status: DC | PRN
  Administered 2020-04-14: 180 mg via INTRAVENOUS

## 2020-04-14 MED ORDER — PROPOFOL 10 MG/ML IV BOLUS
INTRAVENOUS | Status: AC
  Filled 2020-04-14: qty 20

## 2020-04-14 MED ORDER — CHLORHEXIDINE GLUCONATE CLOTH 2 % EX PADS
6.0000 | MEDICATED_PAD | Freq: Every day | CUTANEOUS | Status: DC
  Administered 2020-04-15: 6 via TOPICAL

## 2020-04-14 MED ORDER — LIDOCAINE 2% (20 MG/ML) 5 ML SYRINGE
INTRAMUSCULAR | Status: DC | PRN
  Administered 2020-04-14: 80 mg via INTRAVENOUS

## 2020-04-14 MED ORDER — PHENYLEPHRINE 40 MCG/ML (10ML) SYRINGE FOR IV PUSH (FOR BLOOD PRESSURE SUPPORT)
PREFILLED_SYRINGE | INTRAVENOUS | Status: AC
  Filled 2020-04-14: qty 10

## 2020-04-14 MED ORDER — KETOROLAC TROMETHAMINE 30 MG/ML IJ SOLN: 30.0000 mg | Freq: Once | INTRAMUSCULAR | Status: DC | PRN

## 2020-04-14 MED ORDER — LACTATED RINGERS IV SOLN: INTRAVENOUS | Status: DC

## 2020-04-14 MED ORDER — ONDANSETRON HCL 4 MG/2ML IJ SOLN
INTRAMUSCULAR | Status: AC
  Filled 2020-04-14: qty 2

## 2020-04-14 MED ORDER — KETOROLAC TROMETHAMINE 15 MG/ML IJ SOLN
15.0000 mg | Freq: Four times a day (QID) | INTRAMUSCULAR | Status: AC
  Administered 2020-04-14 – 2020-04-16 (×7): 15 mg via INTRAVENOUS
  Filled 2020-04-14 (×7): qty 1

## 2020-04-14 MED ORDER — GLYCOPYRROLATE PF 0.2 MG/ML IJ SOSY
PREFILLED_SYRINGE | INTRAMUSCULAR | Status: DC | PRN
  Administered 2020-04-14 (×2): .1 mg via INTRAVENOUS

## 2020-04-14 MED ORDER — MIDAZOLAM HCL 2 MG/2ML IJ SOLN
INTRAMUSCULAR | Status: AC
  Filled 2020-04-14: qty 2

## 2020-04-14 MED ORDER — LIDOCAINE 2% (20 MG/ML) 5 ML SYRINGE
INTRAMUSCULAR | Status: AC
  Filled 2020-04-14: qty 5

## 2020-04-14 MED ORDER — ROCURONIUM BROMIDE 10 MG/ML (PF) SYRINGE
PREFILLED_SYRINGE | INTRAVENOUS | Status: AC
  Filled 2020-04-14: qty 10

## 2020-04-14 MED ORDER — CHLORHEXIDINE GLUCONATE 0.12 % MT SOLN
15.0000 mL | Freq: Once | OROMUCOSAL | Status: AC
  Administered 2020-04-14: 15 mL via OROMUCOSAL
  Filled 2020-04-14: qty 15

## 2020-04-14 MED ORDER — SODIUM CHLORIDE FLUSH 0.9 % IV SOLN
INTRAVENOUS | Status: DC | PRN
  Administered 2020-04-14: 60 mL

## 2020-04-14 MED ORDER — SUGAMMADEX SODIUM 200 MG/2ML IV SOLN
INTRAVENOUS | Status: DC | PRN
  Administered 2020-04-14: 130 mg via INTRAVENOUS
  Administered 2020-04-14: 70 mg via INTRAVENOUS

## 2020-04-14 MED ORDER — MIDAZOLAM HCL 5 MG/5ML IJ SOLN
INTRAMUSCULAR | Status: DC | PRN
  Administered 2020-04-14: 2 mg via INTRAVENOUS

## 2020-04-14 MED ORDER — PHENYLEPHRINE HCL-NACL 10-0.9 MG/250ML-% IV SOLN
INTRAVENOUS | Status: DC | PRN
  Administered 2020-04-14: 15 ug/min via INTRAVENOUS

## 2020-04-14 MED ORDER — CEFAZOLIN SODIUM-DEXTROSE 2-4 GM/100ML-% IV SOLN
2.0000 g | INTRAVENOUS | Status: AC
  Administered 2020-04-14: 2 g via INTRAVENOUS
  Filled 2020-04-14: qty 100

## 2020-04-14 MED ORDER — LACTATED RINGERS IV SOLN: INTRAVENOUS | Status: DC | PRN

## 2020-04-14 MED ORDER — FENTANYL CITRATE (PF) 100 MCG/2ML IJ SOLN
INTRAMUSCULAR | Status: DC | PRN
  Administered 2020-04-14 (×4): 50 ug via INTRAVENOUS
  Administered 2020-04-14: 100 ug via INTRAVENOUS
  Administered 2020-04-14 (×2): 50 ug via INTRAVENOUS

## 2020-04-14 MED ORDER — DEXAMETHASONE SODIUM PHOSPHATE 10 MG/ML IJ SOLN
INTRAMUSCULAR | Status: DC | PRN
  Administered 2020-04-14: 4 mg via INTRAVENOUS

## 2020-04-14 MED ORDER — BUPIVACAINE LIPOSOME 1.3 % IJ SUSP
20.0000 mL | Freq: Once | INTRAMUSCULAR | Status: DC
  Filled 2020-04-14: qty 20

## 2020-04-14 SURGICAL SUPPLY — 115 items
ANCHOR CATH FOLEY SECURE (MISCELLANEOUS) ×2 IMPLANT
APPLICATOR TIP EXT COSEAL (VASCULAR PRODUCTS) IMPLANT
BLADE CLIPPER SURG (BLADE) IMPLANT
BLADE SURG 11 STRL SS (BLADE) ×2 IMPLANT
BNDG COHESIVE 6X5 TAN STRL LF (GAUZE/BANDAGES/DRESSINGS) IMPLANT
CANISTER SUCT 3000ML PPV (MISCELLANEOUS) ×4 IMPLANT
CANNULA REDUC XI 12-8 STAPL (CANNULA) ×2
CANNULA REDUCER 12-8 DVNC XI (CANNULA) ×2 IMPLANT
CATH THORACIC 28FR (CATHETERS) IMPLANT
CATH THORACIC 36FR (CATHETERS) IMPLANT
CATH THORACIC 36FR RT ANG (CATHETERS) IMPLANT
CLIP VESOCCLUDE MED 6/CT (CLIP) IMPLANT
CNTNR URN SCR LID CUP LEK RST (MISCELLANEOUS) ×5 IMPLANT
CONN ST 1/4X3/8  BEN (MISCELLANEOUS) ×1
CONN ST 1/4X3/8 BEN (MISCELLANEOUS) ×1 IMPLANT
CONT SPEC 4OZ STRL OR WHT (MISCELLANEOUS) ×5
DEFOGGER SCOPE WARMER CLEARIFY (MISCELLANEOUS) ×2 IMPLANT
DERMABOND ADVANCED (GAUZE/BANDAGES/DRESSINGS) ×1
DERMABOND ADVANCED .7 DNX12 (GAUZE/BANDAGES/DRESSINGS) ×1 IMPLANT
DISSECTOR BLUNT TIP ENDO 5MM (MISCELLANEOUS) IMPLANT
DRAIN CHANNEL 28F RND 3/8 FF (WOUND CARE) ×2 IMPLANT
DRAPE ARM DVNC X/XI (DISPOSABLE) ×4 IMPLANT
DRAPE COLUMN DVNC XI (DISPOSABLE) ×1 IMPLANT
DRAPE CV SPLIT W-CLR ANES SCRN (DRAPES) ×2 IMPLANT
DRAPE DA VINCI XI ARM (DISPOSABLE) ×4
DRAPE DA VINCI XI COLUMN (DISPOSABLE) ×1
DRAPE HALF SHEET 40X57 (DRAPES) ×2 IMPLANT
DRAPE ORTHO SPLIT 77X108 STRL (DRAPES) ×1
DRAPE SURG ORHT 6 SPLT 77X108 (DRAPES) ×1 IMPLANT
ELECT BLADE 4.0 EZ CLEAN MEGAD (MISCELLANEOUS) ×2
ELECT REM PT RETURN 9FT ADLT (ELECTROSURGICAL) ×2
ELECTRODE BLDE 4.0 EZ CLN MEGD (MISCELLANEOUS) ×1 IMPLANT
ELECTRODE REM PT RTRN 9FT ADLT (ELECTROSURGICAL) ×1 IMPLANT
GAUZE KITTNER 4X8 (MISCELLANEOUS) ×4 IMPLANT
GAUZE SPONGE 4X4 12PLY STRL (GAUZE/BANDAGES/DRESSINGS) ×2 IMPLANT
GAUZE XEROFORM 1X8 LF (GAUZE/BANDAGES/DRESSINGS) ×2 IMPLANT
GLOVE BIO SURGEON STRL SZ 6.5 (GLOVE) ×6 IMPLANT
GLOVE BIOGEL PI IND STRL 6.5 (GLOVE) ×2 IMPLANT
GLOVE BIOGEL PI INDICATOR 6.5 (GLOVE) ×2
GLOVE ECLIPSE 7.5 STRL STRAW (GLOVE) ×4 IMPLANT
GLOVE ECLIPSE 8.0 STRL XLNG CF (GLOVE) ×4 IMPLANT
GLOVE SURG SS PI 6.0 STRL IVOR (GLOVE) ×2 IMPLANT
GLOVE SURG SS PI 8.0 STRL IVOR (GLOVE) ×2 IMPLANT
GOWN STRL REIN XL XLG (GOWN DISPOSABLE) ×2 IMPLANT
GOWN STRL REUS W/ TWL LRG LVL3 (GOWN DISPOSABLE) ×4 IMPLANT
GOWN STRL REUS W/TWL 2XL LVL3 (GOWN DISPOSABLE) ×6 IMPLANT
GOWN STRL REUS W/TWL LRG LVL3 (GOWN DISPOSABLE) ×4
HEMOSTAT SURGICEL 2X14 (HEMOSTASIS) ×4 IMPLANT
IRRIGATION STRYKERFLOW (MISCELLANEOUS) ×1 IMPLANT
IRRIGATOR STRYKERFLOW (MISCELLANEOUS) ×2
IRRIGATOR SUCT 8 DISP DVNC XI (IRRIGATION / IRRIGATOR) ×1 IMPLANT
IRRIGATOR SUCTION 8MM XI DISP (IRRIGATION / IRRIGATOR) ×1
KIT BASIN OR (CUSTOM PROCEDURE TRAY) ×2 IMPLANT
KIT TURNOVER KIT B (KITS) ×2 IMPLANT
NEEDLE SPNL 18GX3.5 QUINCKE PK (NEEDLE) ×2 IMPLANT
NS IRRIG 1000ML POUR BTL (IV SOLUTION) ×2 IMPLANT
OBTURATOR OPTICAL STANDARD 8MM (TROCAR) ×1
OBTURATOR OPTICAL STND 8 DVNC (TROCAR) ×1
OBTURATOR OPTICALSTD 8 DVNC (TROCAR) ×1 IMPLANT
PACK CHEST (CUSTOM PROCEDURE TRAY) ×2 IMPLANT
PAD ARMBOARD 7.5X6 YLW CONV (MISCELLANEOUS) ×10 IMPLANT
PASSER SUT SWANSON 36MM LOOP (INSTRUMENTS) IMPLANT
PENCIL SMOKE EVACUATOR (MISCELLANEOUS) ×2 IMPLANT
PORT ACCESS TROCAR AIRSEAL 12 (TROCAR) ×1 IMPLANT
PORT ACCESS TROCAR AIRSEAL 5M (TROCAR) ×1
POUCH RETRIEVAL ECOSAC 10 (ENDOMECHANICALS) IMPLANT
POUCH RETRIEVAL ECOSAC 10MM (ENDOMECHANICALS)
RELOAD STAPLER 3.5X45 BLU DVNC (STAPLE) ×1 IMPLANT
SCISSORS LAP 5X35 DISP (ENDOMECHANICALS) ×2 IMPLANT
SEAL CANN UNIV 5-8 DVNC XI (MISCELLANEOUS) ×2 IMPLANT
SEAL XI 5MM-8MM UNIVERSAL (MISCELLANEOUS) ×2
SEALANT PROGEL (MISCELLANEOUS) IMPLANT
SEALANT SURG COSEAL 4ML (VASCULAR PRODUCTS) IMPLANT
SEALANT SURG COSEAL 8ML (VASCULAR PRODUCTS) IMPLANT
SEALER LIGASURE MARYLAND 30 (ELECTROSURGICAL) IMPLANT
SET TRI-LUMEN FLTR TB AIRSEAL (TUBING) ×2 IMPLANT
SOLUTION ELECTROLUBE (MISCELLANEOUS) ×2 IMPLANT
STAPLER 45 DA VINCI SURE FORM (STAPLE) ×1
STAPLER 45 SUREFORM DVNC (STAPLE) ×1 IMPLANT
STAPLER CANNULA SEAL DVNC XI (STAPLE) ×2 IMPLANT
STAPLER CANNULA SEAL XI (STAPLE) ×2
STAPLER RELOAD 3.5X45 BLU DVNC (STAPLE) ×1
STAPLER RELOAD 3.5X45 BLUE (STAPLE) ×1
STOPCOCK 4 WAY LG BORE MALE ST (IV SETS) ×2 IMPLANT
SUT PROLENE 3 0 SH DA (SUTURE) IMPLANT
SUT PROLENE 4 0 RB 1 (SUTURE)
SUT PROLENE 4-0 RB1 .5 CRCL 36 (SUTURE) IMPLANT
SUT SILK  1 MH (SUTURE) ×3
SUT SILK 1 MH (SUTURE) ×3 IMPLANT
SUT SILK 1 TIES 10X30 (SUTURE) IMPLANT
SUT SILK 2 0 SH (SUTURE) ×2 IMPLANT
SUT SILK 2 0SH CR/8 30 (SUTURE) IMPLANT
SUT VIC AB 1 CTX 18 (SUTURE) IMPLANT
SUT VIC AB 1 CTX 36 (SUTURE)
SUT VIC AB 1 CTX36XBRD ANBCTR (SUTURE) IMPLANT
SUT VIC AB 2-0 CTX 36 (SUTURE) IMPLANT
SUT VIC AB 3-0 X1 27 (SUTURE) ×4 IMPLANT
SUT VICRYL 0 TIES 12 18 (SUTURE) ×2 IMPLANT
SUT VICRYL 0 UR6 27IN ABS (SUTURE) ×4 IMPLANT
SUT VICRYL 2 TP 1 (SUTURE) IMPLANT
SYR 10ML LL (SYRINGE) ×2 IMPLANT
SYR 20ML ECCENTRIC (SYRINGE) ×2 IMPLANT
SYR 20ML LL LF (SYRINGE) ×2 IMPLANT
SYR 50ML LL SCALE MARK (SYRINGE) ×2 IMPLANT
SYR BULB IRRIG 60ML STRL (SYRINGE) ×2 IMPLANT
SYSTEM SAHARA CHEST DRAIN ATS (WOUND CARE) ×2 IMPLANT
TAPE CLOTH 4X10 WHT NS (GAUZE/BANDAGES/DRESSINGS) ×2 IMPLANT
TAPE CLOTH SURG 4X10 WHT LF (GAUZE/BANDAGES/DRESSINGS) ×2 IMPLANT
TAPE UMBILICAL COTTON 1/8X30 (MISCELLANEOUS) IMPLANT
TIP APPLICATOR SPRAY EXTEND 16 (VASCULAR PRODUCTS) IMPLANT
TOWEL GREEN STERILE (TOWEL DISPOSABLE) ×2 IMPLANT
TRAY FOLEY MTR SLVR 16FR STAT (SET/KITS/TRAYS/PACK) ×2 IMPLANT
TROCAR XCEL BLADELESS 5X75MML (TROCAR) ×2 IMPLANT
TUBING EXTENTION W/L.L. (IV SETS) ×2 IMPLANT
WATER STERILE IRR 1000ML POUR (IV SOLUTION) ×2 IMPLANT

## 2020-04-14 NOTE — Progress Notes (Signed)
      [address]       [place] [address]             [phone]    Pre Procedure note for inpatients:   Christine Guerrero has been scheduled for Procedure(s): XI ROBOTIC ASSISTED THORASCOPY, possible bleb stapling (Right) today. The various methods of treatment have been discussed with the patient. After consideration of the risks, benefits and treatment options the patient has consented to the planned procedure.   The patient has been seen and labs reviewed. There are no changes in the patient's condition to prevent proceeding with the planned procedure today.  Recent labs:  Lab Results  Component Value Date   WBC 4.3 04/14/2020   HGB 12.5 04/14/2020   HCT 38.2 04/14/2020   PLT 225 04/14/2020   GLUCOSE 91 04/14/2020   ALT 32 04/14/2020   AST 24 04/14/2020   NA 138 04/14/2020   K 3.9 04/14/2020   CL 104 04/14/2020   CREATININE 0.75 04/14/2020   BUN 15 04/14/2020   CO2 26 04/14/2020   INR 1.0 04/14/2020    Delight Ovens, MD 04/14/2020 1:08 PM

## 2020-04-14 NOTE — Progress Notes (Signed)
Patient went to preop with OR escort. Report given to  Darl Pikes, RN. Consent obtained and on chart. Pre-op check list completed. Husband went to waiting room.

## 2020-04-14 NOTE — Progress Notes (Signed)
Transferred from [facility] by bed sleepy but arousal. Chest tube to right chest intact draining to bloody output.

## 2020-04-14 NOTE — Progress Notes (Signed)
PROGRESS NOTE    Brandie Lopes  GBT:[id number] DOB: March 31, 1971 DOA: 04/05/2020 PCP: Bernita Raisin., MD    Brief Narrative:  Mrs. Bayliss was admitted to the hospital with the working diagnosis of right spontaneous pneumothorax.   49 yo female with past medical history of prior right pneumothorax sp VATS and pleurodesis in 2018. Reported 2 weeks of dyspnea, worsening associated with chest pain.  On her initial physical examination her blood pressure was 185/96, HR 74, RR 28, oxygen saturation 97%. Her lungs had no wheezing, or rhonchi, s1 and s2 present and rhythmic, abdomen soft and no lower extremity edema.   Chest film with right pneumothorax.   Assessment & Plan:   Principal Problem:   Pneumothorax on right   1. Right spontaneous pneumothorax, failed chest tube management, patient today had VTAS with right lower lobe bleb stapling and right pleural biopsy.   No has a chest tube in place.  Continue pain control and oxymetry monitoring. Follow CT surgery recommendations.      Status is: Inpatient  Remains inpatient appropriate because:Inpatient level of care appropriate due to severity of illness   Dispo: The patient is from: Home              Anticipated d/c is to: Home              Anticipated d/c date is: 2 days              Patient currently is not medically stable to d/c.       DVT prophylaxis: Enoxaparin   Code Status:   full  Family Communication:  No family at the bedside      Consultants:   Ct surgery   Procedures:   Chest tube  Right  Right VATS. Bleb stapling right lower lobe      Subjective: Patient at PACU, continue with mild sedation, able to respond to simple questions with her head, follows commands.   Objective: Vitals:   04/12/20 2216 04/13/20 0626 04/14/20 0529 04/14/20 1219  BP: 129/79  115/62   Pulse: (!) 48 62 (!) 50   Resp: 16  13   Temp: (!) 97.4 F (36.3 C) 98 F (36.7 C) 97.8 F (36.6 C)   TempSrc:   Oral Oral   SpO2: 100% 100% 100%   Weight:    62.6 kg  Height:    5' 5.98" (1.676 m)    Intake/Output Summary (Last 24 hours) at 04/14/2020 1759 Last data filed at 04/14/2020 1735 Gross per 24 hour  Intake 2000 ml  Output 856 ml  Net 1144 ml   Filed Weights   04/05/20 1544 04/14/20 1219  Weight: 62.6 kg 62.6 kg    Examination:   General: Not in pain or dyspnea, deconditioned  Neurology: mild sedation  E ENT: no pallor, no icterus, oral mucosa moist Cardiovascular: No JVD. S1-S2 present, rhythmic, no gallops, rubs, or murmurs. No lower extremity edema. Pulmonary: positive breath sounds bilaterally,decreased breath sounds on the right Gastrointestinal. Abdomen soft and non tender Skin. No rashes Musculoskeletal: no joint deformities Right sided chest tube    Data Reviewed: I have personally reviewed following labs and imaging studies  CBC: Recent Labs  Lab 04/10/20 0931 04/14/20 0934  WBC 4.2 4.3  NEUTROABS 1.6*  --   HGB 12.4 12.5  HCT 37.9 38.2  MCV 89.6 89.5  PLT 219 225   Basic Metabolic Panel: Recent Labs  Lab 04/10/20 0931 04/14/20 0934  NA 135 138  K 4.1 3.9  CL 101 104  CO2 27 26  GLUCOSE 81 91  BUN 12 15  CREATININE 0.83 0.75  CALCIUM 9.0 8.9  MG 1.9  --    GFR: Estimated Creatinine Clearance: 79.6 mL/min (by C-G formula based on SCr of 0.75 mg/dL). Liver Function Tests: Recent Labs  Lab 04/14/20 0934  AST 24  ALT 32  ALKPHOS 45  BILITOT 0.4  PROT 6.0*  ALBUMIN 3.6   No results for input(s): LIPASE, AMYLASE in the last 168 hours. No results for input(s): AMMONIA in the last 168 hours. Coagulation Profile: Recent Labs  Lab 04/14/20 0934  INR 1.0   Cardiac Enzymes: No results for input(s): CKTOTAL, CKMB, CKMBINDEX, TROPONINI in the last 168 hours. BNP (last 3 results) No results for input(s): PROBNP in the last 8760 hours. HbA1C: No results for input(s): HGBA1C in the last 72 hours. CBG: Recent Labs  Lab 04/14/20 0854    GLUCAP 92   Lipid Profile: No results for input(s): CHOL, HDL, LDLCALC, TRIG, CHOLHDL, LDLDIRECT in the last 72 hours. Thyroid Function Tests: No results for input(s): TSH, T4TOTAL, FREET4, T3FREE, THYROIDAB in the last 72 hours. Anemia Panel: No results for input(s): VITAMINB12, FOLATE, FERRITIN, TIBC, IRON, RETICCTPCT in the last 72 hours.    Radiology Studies: I have reviewed all of the imaging during this hospital visit personally     Scheduled Meds: . bupivacaine liposome  20 mL Infiltration Once  . [MAR Hold] diclofenac  75 mg Oral Q breakfast  . [MAR Hold] estradiol  1 mg Oral Daily  . [MAR Hold] ezetimibe  10 mg Oral QPM  . influenza vac split quadrivalent PF  0.5 mL Intramuscular Tomorrow-1000  . ketorolac  15 mg Intravenous Q6H  . [MAR Hold] loratadine  10 mg Oral Daily  . [MAR Hold] mupirocin ointment  1 application Nasal BID  . [MAR Hold] pantoprazole  40 mg Oral Daily  . [MAR Hold] sertraline  25 mg Oral Daily   Continuous Infusions: . lactated ringers 10 mL/hr at 04/14/20 1222     LOS: 9 days        [place] Annett Gula, MD

## 2020-04-14 NOTE — Anesthesia Procedure Notes (Signed)
Arterial Line Insertion Start/End11/17/2021 11:30 AM, 04/14/2020 11:35 AM Performed by: Demetrio Lapping, CRNA, CRNA  Patient location: Pre-op. Preanesthetic checklist: patient identified, IV checked, site marked, risks and benefits discussed, surgical consent, monitors and equipment checked, pre-op evaluation, timeout performed and anesthesia consent Lidocaine 1% used for infiltration and patient sedated Left, radial was placed Catheter size: 20 G Hand hygiene performed  and maximum sterile barriers used   Attempts: 1 Procedure performed without using ultrasound guided technique. Following insertion, dressing applied and Biopatch. Post procedure assessment: normal

## 2020-04-14 NOTE — Anesthesia Procedure Notes (Signed)
Procedure Name: Intubation Date/Time: 04/14/2020 1:35 PM Performed by: Lowella Dell, CRNA Pre-anesthesia Checklist: Patient identified, Emergency Drugs available, Suction available and Patient being monitored Patient Re-evaluated:Patient Re-evaluated prior to induction Oxygen Delivery Method: Circle System Utilized Preoxygenation: Pre-oxygenation with 100% oxygen Induction Type: IV induction Ventilation: Mask ventilation without difficulty Laryngoscope Size: Mac and 3 Grade View: Grade I Endobronchial tube: Left, EBT position confirmed by auscultation, EBT position confirmed by fiberoptic bronchoscope and Double lumen EBT and 37 Fr Number of attempts: 1 Airway Equipment and Method: Stylet and Fiberoptic brochoscope Placement Confirmation: ETT inserted through vocal cords under direct vision,  positive ETCO2 and breath sounds checked- equal and bilateral Secured at: 30 cm Tube secured with: Tape Dental Injury: Teeth and Oropharynx as per pre-operative assessment

## 2020-04-14 NOTE — Progress Notes (Signed)
On call hospitalist Loney Loh) paged for new result of CXR, increasing R pneumothorax. Advised to call cardiac thoracic Dorris Fetch) for interventions. Hendrickson paged, awaiting response. Updated Rathore on awaiting orders/intervention. Rathore stated to return call to [facility] in 15 minutes, if no response to contact on call [facility]. P.M. RN updated Cordelia Pen (A.M. RN) regarding recent results and who to contact regarding chest tube/pneumothorax.

## 2020-04-14 NOTE — Brief Op Note (Addendum)
      [address]       [place] [address]             [phone]      04/14/2020  4:56 PM  PATIENT:  Christine Guerrero  49 y.o. female  PRE-OPERATIVE DIAGNOSIS:  RIGHT SPONTANEOUS PNEUMOTHORAX WITH AIRLEAK  POST-OPERATIVE DIAGNOSIS:  RIGHT SPONTANEOUS PNEUMOTHORAX WITH AIRLEAK  PROCEDURE: VIDEO BRONCHOSCOPY, XI ROBOTIC ASSISTED RIGHT THORASCOPY, RIGHT LOWER LOBE BLEB STAPLING, RIGHT PLEURAL BIOPSY, and INTERCOSTAL NERVE BLOCK (Right)  SURGEON:  Surgeon(s) and Role:    Delight Ovens, MD - Primary  PHYSICIAN ASSISTANT: 1. Jari Favre PA-C 2. Donielle Joycelyn Man PA-C  ANESTHESIA:   general  EBL:  Per anesthesia record   BLOOD ADMINISTERED:none  DRAINS: 28 Blake drain placed in the right pleural space   LOCAL MEDICATIONS USED:  OTHER Exparel  SPECIMEN:  Source of Specimen:  Right pleural biopsy and RLL bleb  DISPOSITION OF SPECIMEN:  Pathology. Right pleural biopsy likely from previous TACL (NOT a malignancy)  COUNTS CORRECT:  YES  DICTATION: .Dragon Dictation  PLAN OF CARE: Admit to inpatient   PATIENT DISPOSITION:  PACU - hemodynamically stable.   Delay start of Pharmacological VTE agent (>24hrs) due to surgical blood loss or risk of bleeding: yes

## 2020-04-14 NOTE — Anesthesia Preprocedure Evaluation (Addendum)
Anesthesia Evaluation   Patient awake    Reviewed: Allergy & Precautions, NPO status , Patient's Chart, lab work & pertinent test results  Airway Mallampati: II  TM Distance: >3 FB     Dental  (+) Teeth Intact, Dental Advisory Given   Pulmonary  History noted CG   breath sounds clear to auscultation       Cardiovascular  Rhythm:Regular Rate:Normal     Neuro/Psych    GI/Hepatic negative GI ROS, Neg liver ROS,   Endo/Other  negative endocrine ROS  Renal/GU negative Renal ROS     Musculoskeletal   Abdominal   Peds  Hematology   Anesthesia Other Findings   Reproductive/Obstetrics                            Anesthesia Physical Anesthesia Plan  ASA: II  Anesthesia Plan: General   Post-op Pain Management:    Induction:   PONV Risk Score and Plan: 4 or greater and Ondansetron, Dexamethasone and Midazolam  Airway Management Planned: Double Lumen EBT  Additional Equipment:   Intra-op Plan:   Post-operative Plan: Possible Post-op intubation/ventilation  Informed Consent: I have reviewed the patients History and Physical, chart, labs and discussed the procedure including the risks, benefits and alternatives for the proposed anesthesia with the patient or authorized representative who has indicated his/her understanding and acceptance.     Dental advisory given  Plan Discussed with: CRNA, Anesthesiologist and Surgeon  Anesthesia Plan Comments:         Anesthesia Quick Evaluation

## 2020-04-14 NOTE — Progress Notes (Signed)
      [address]       [place] [address]             [phone]           Subjective: Patient without complaints this am. She has not eaten yet.  Objective: Vital signs in last 24 hours: Temp:  [97.8 F (36.6 C)] 97.8 F (36.6 C) (11/17 0529) Pulse Rate:  [50] 50 (11/17 0529) Cardiac Rhythm: Normal sinus rhythm (11/16 1910) Resp:  [13] 13 (11/17 0529) BP: (115)/(62) 115/62 (11/17 0529) SpO2:  [100 %] 100 % (11/17 0529)    Intake/Output from previous day: 11/16 0701 - 11/17 0700 In: -  Out: 6 [Chest Tube:6]   Physical Exam:  Cardiovascular: RRR Pulmonary: Clear to auscultation on left and diminished right basilar breath sound Wounds: Dressing is clean and dry.  Chest Tube: to water seal; small, very intermittent air leak with cough  Lab Results: CBC: No results for input(s): WBC, HGB, HCT, PLT in the last 72 hours. BMET:  No results for input(s): NA, K, CL, CO2, GLUCOSE, BUN, CREATININE, CALCIUM in the last 72 hours.  PT/INR: No results for input(s): LABPROT, INR in the last 72 hours. ABG:  INR: Will add last result for INR, ABG once components are confirmed Will add last 4 CBG results once components are confirmed  Assessment/Plan:  1. CV -SR 2.  Pulmonary - S/p right 59 French pigtail chest tube 11/09. Right pigtail chest tube with scant output last 24 hours. Chest tube is to water seal and there is a very small, intermittent air leak with cough.  CXR this am shows a right basilar pneumothorax (chest tube was clamped in am then unclamped in afternoon). Patient to OR for robotic assisted right VATS, possible pleurectomy, and possible pleurodesis by Dr. Tyrone Sage.  Oskar Cretella M ZimmermanPA-C 04/14/2020,7:12 AM [phone]

## 2020-04-14 NOTE — Transfer of Care (Signed)
Immediate Anesthesia Transfer of Care Note  Patient: Christine Guerrero  Procedure(s) Performed: XI ROBOTIC ASSISTED THORASCOPY, RIGHT LOWER LOBE BLEB STAPLING AND RIGHT PLEURAL BIOPSY (Right Chest) VIDEO BRONCHOSCOPY (Right Chest) INTERCOSTAL NERVE BLOCK (Right Chest)  Patient Location: PACU  Anesthesia Type:General  Level of Consciousness: lethargic and responds to stimulation  Airway & Oxygen Therapy: Patient Spontanous Breathing and Patient connected to face mask oxygen  Post-op Assessment: Report given to RN  Post vital signs: Reviewed and stable  Last Vitals:  Vitals Value Taken Time  BP 147/94 04/14/20 1744  Temp    Pulse 75 04/14/20 1744  Resp 19 04/14/20 1744  SpO2 100 % 04/14/20 1744  Vitals shown include unvalidated device data.  Last Pain:  Vitals:   04/14/20 0529  TempSrc: Oral  PainSc:       Patients Stated Pain Goal: 0 (04/12/20 1419)  Complications: No complications documented.

## 2020-04-15 ENCOUNTER — Inpatient Hospital Stay (HOSPITAL_COMMUNITY): Payer: BC Managed Care – PPO

## 2020-04-15 ENCOUNTER — Encounter (HOSPITAL_COMMUNITY): Payer: Self-pay | Admitting: Cardiothoracic Surgery

## 2020-04-15 DIAGNOSIS — F32A Depression, unspecified: Secondary | ICD-10-CM

## 2020-04-15 DIAGNOSIS — F419 Anxiety disorder, unspecified: Secondary | ICD-10-CM

## 2020-04-15 DIAGNOSIS — E785 Hyperlipidemia, unspecified: Secondary | ICD-10-CM | POA: Diagnosis not present

## 2020-04-15 DIAGNOSIS — J939 Pneumothorax, unspecified: Secondary | ICD-10-CM | POA: Diagnosis not present

## 2020-04-15 DIAGNOSIS — Z938 Other artificial opening status: Secondary | ICD-10-CM

## 2020-04-15 LAB — BASIC METABOLIC PANEL
Anion gap: 6 (ref 5–15)
BUN: 11 mg/dL (ref 6–20)
CO2: 26 mmol/L (ref 22–32)
Calcium: 8.3 mg/dL — ABNORMAL LOW (ref 8.9–10.3)
Chloride: 104 mmol/L (ref 98–111)
Creatinine, Ser: 0.78 mg/dL (ref 0.44–1.00)
GFR, Estimated: >60 mL/min (ref >60)
Glucose, Bld: 130 mg/dL — ABNORMAL HIGH (ref 70–99)
Potassium: 4.4 mmol/L (ref 3.5–5.1)
Sodium: 136 mmol/L (ref 135–145)

## 2020-04-15 MED ORDER — CYCLOBENZAPRINE HCL 10 MG PO TABS
5.0000 mg | ORAL_TABLET | Freq: Three times a day (TID) | ORAL | Status: DC | PRN
  Administered 2020-04-15 – 2020-04-16 (×2): 5 mg via ORAL
  Filled 2020-04-15 (×2): qty 1

## 2020-04-15 NOTE — Anesthesia Postprocedure Evaluation (Signed)
Anesthesia Post Note  Patient: Christine Guerrero  Procedure(s) Performed: XI ROBOTIC ASSISTED THORASCOPY, RIGHT LOWER LOBE BLEB STAPLING AND RIGHT PLEURAL BIOPSY (Right Chest) VIDEO BRONCHOSCOPY (Right Chest) INTERCOSTAL NERVE BLOCK (Right Chest)     Patient location during evaluation: PACU Anesthesia Type: General Level of consciousness: awake and alert Pain management: pain level controlled Vital Signs Assessment: post-procedure vital signs reviewed and stable Respiratory status: spontaneous breathing, nonlabored ventilation, respiratory function stable and patient connected to nasal cannula oxygen Cardiovascular status: blood pressure returned to baseline and stable Postop Assessment: no apparent nausea or vomiting Anesthetic complications: no   No complications documented.  Last Vitals:  Vitals:   04/14/20 2313 04/15/20 0320  BP: 108/62 99/60  Pulse: 62 60  Resp: 13 17  Temp: 36.7 C 36.6 C  SpO2: 96% 96%    Last Pain:  Vitals:   04/15/20 0320  TempSrc: Oral  PainSc:                  Kerianne Gurr S

## 2020-04-15 NOTE — Progress Notes (Signed)
PROGRESS NOTE    Christine Guerrero  JIR:[id number] DOB: Oct 12, 1970 DOA: 04/05/2020 PCP: Bernita Raisin., MD    Brief Narrative:  Christine Guerrero was admitted to the hospital with the working diagnosis of right spontaneous pneumothorax.   49 yo female with past medical history of prior right pneumothorax sp VATS and pleurodesis in 2018. Reported 2 weeks of dyspnea, worsening associated with chest pain.  On her initial physical examination her blood pressure was 185/96, HR 74, RR 28, oxygen saturation 97%. Her lungs had no wheezing, or rhonchi, s1 and s2 present and rhythmic, abdomen soft and no lower extremity edema.   Chest film with right pneumothorax.     Assessment & Plan:   Principal Problem:   Pneumothorax on right Active Problems:   Depression   Dyslipidemia   Anxiety   Status post chest tube placement (HCC)    1. Right spontaneous pneumothorax, failed chest tube management, now sp VTAS with right lower lobe bleb stapling and right pleural biopsy.   She continue to have chest pain at the site of the chest tube, controlled with analgesics.  Chest tube drainage 256 cc since yesterday. No air leak. Chest film personally reviewed notes right lung expansion.   Chest tube now to water seal. Continue pain control with ketorolac, acetaminophen and IV morphine.  Out of bed to chair tid with meals, PT and OT.  Follow up with chest film in am and CT surgery recommendations.    2. Dyslipidemia. Continue with ezetimibe.   3. Depression and anxiety. Continue with sertraline and as needed lorazepam.   Patient continue to be at risk of worsening pneumothorax.   Status is: Inpatient  Remains inpatient appropriate because:Inpatient level of care appropriate due to severity of illness   Dispo: The patient is from: Home              Anticipated d/c is to: Home              Anticipated d/c date is: 3 days              Patient currently is not medically stable to  d/c.    DVT prophylaxis: Enoxaparin   Code Status:   full  Family Communication:  No family at the bedside      Consultants:   IR  CT surgery   Procedures:   Chest tube right side  VATS       Subjective: Patient continue to have chest pain at the site of the chest tube worse with deep inspiration, improved with analgesics, no nausea or vomiting.   Objective: Vitals:   04/14/20 1915 04/14/20 2000 04/14/20 2313 04/15/20 0320  BP: (!) 152/95 133/80 108/62 99/60  Pulse: 71 74 62 60  Resp: 16 11 13 17   Temp:  98.1 F (36.7 C) 98 F (36.7 C) 97.8 F (36.6 C)  TempSrc:  Oral Oral Oral  SpO2: 95% 94% 96% 96%  Weight:      Height:        Intake/Output Summary (Last 24 hours) at 04/15/2020 1247 Last data filed at 04/15/2020 0900 Gross per 24 hour  Intake 2240 ml  Output 1706 ml  Net 534 ml   Filed Weights   04/05/20 1544 04/14/20 1219  Weight: 62.6 kg 62.6 kg    Examination:   General: Not in pain or dyspnea,.  Neurology: Awake and alert, non focal  E ENT: no pallor, no icterus, oral mucosa moist Cardiovascular: No JVD. S1-S2 present, rhythmic,  no gallops, rubs, or murmurs. No lower extremity edema. Pulmonary: positive breath sounds bilaterally, with no wheezing, rhonchi or rales. Gastrointestinal. Abdomen soft and non tender Skin. No rashes Musculoskeletal: no joint deformities     Data Reviewed: I have personally reviewed following labs and imaging studies  CBC: Recent Labs  Lab 04/10/20 0931 04/14/20 0934 04/14/20 1924  WBC 4.2 4.3 9.5  NEUTROABS 1.6*  --  8.6*  HGB 12.4 12.5 12.3  HCT 37.9 38.2 37.2  MCV 89.6 89.5 89.6  PLT 219 225 207   Basic Metabolic Panel: Recent Labs  Lab 04/10/20 0931 04/14/20 0934 04/15/20 0047  NA 135 138 136  K 4.1 3.9 4.4  CL 101 104 104  CO2 27 26 26   GLUCOSE 81 91 130*  BUN 12 15 11   CREATININE 0.83 0.75 0.78  CALCIUM 9.0 8.9 8.3*  MG 1.9  --   --    GFR: Estimated Creatinine Clearance: 79.6  mL/min (by C-G formula based on SCr of 0.78 mg/dL). Liver Function Tests: Recent Labs  Lab 04/14/20 0934  AST 24  ALT 32  ALKPHOS 45  BILITOT 0.4  PROT 6.0*  ALBUMIN 3.6   No results for input(s): LIPASE, AMYLASE in the last 168 hours. No results for input(s): AMMONIA in the last 168 hours. Coagulation Profile: Recent Labs  Lab 04/14/20 0934  INR 1.0   Cardiac Enzymes: No results for input(s): CKTOTAL, CKMB, CKMBINDEX, TROPONINI in the last 168 hours. BNP (last 3 results) No results for input(s): PROBNP in the last 8760 hours. HbA1C: No results for input(s): HGBA1C in the last 72 hours. CBG: Recent Labs  Lab 04/14/20 0854  GLUCAP 92   Lipid Profile: No results for input(s): CHOL, HDL, LDLCALC, TRIG, CHOLHDL, LDLDIRECT in the last 72 hours. Thyroid Function Tests: No results for input(s): TSH, T4TOTAL, FREET4, T3FREE, THYROIDAB in the last 72 hours. Anemia Panel: No results for input(s): VITAMINB12, FOLATE, FERRITIN, TIBC, IRON, RETICCTPCT in the last 72 hours.    Radiology Studies: I have reviewed all of the imaging during this hospital visit personally     Scheduled Meds: . bupivacaine liposome  20 mL Infiltration Once  . Chlorhexidine Gluconate Cloth  6 each Topical Daily  . diclofenac  75 mg Oral Q breakfast  . estradiol  1 mg Oral Daily  . ezetimibe  10 mg Oral QPM  . influenza vac split quadrivalent PF  0.5 mL Intramuscular Tomorrow-1000  . ketorolac  15 mg Intravenous Q6H  . loratadine  10 mg Oral Daily  . mupirocin ointment  1 application Nasal BID  . pantoprazole  40 mg Oral Daily  . sertraline  25 mg Oral Daily   Continuous Infusions:   LOS: 10 days        Jevan Gaunt 04/16/20, MD

## 2020-04-15 NOTE — Progress Notes (Signed)
Patient tolerated clear liquids then full liquids without issue. Will advance to regular diet for morning meal.

## 2020-04-15 NOTE — Op Note (Addendum)
NAME: Christine Guerrero, Christine Guerrero MEDICAL RECORD [id number] ACCOUNT [id number] DATE OF BIRTH:1970/12/21 FACILITY: [facility] LOCATION: [facility] PHYSICIAN:Kelani Robart Bari Maurice Ramseur, MD  OPERATIVE REPORT  DATE OF PROCEDURE:  04/14/2020  PREOPERATIVE DIAGNOSIS:  Recurrent right spontaneous pneumothorax with persistent air leak.  POSTOPERATIVE DIAGNOSIS:  Recurrent right spontaneous pneumothorax with persistent air leak.  SURGICAL PROCEDURE PERFORMED:  Bronchoscopy, right robotic-assisted thoracoscopy with pleural biopsy and stapling of the lower lobe bleb and intercostal nerve block.  SURGEON:  Sheliah Plane, MD  FIRST ASSISTANT:  Jari Favre, PA.  BRIEF HISTORY:  The patient is a 49 year old female who in 2018 had presented with a spontaneous pneumothorax.  At that time, she had undergone a right video-assisted thoracoscopy with stapling of apical blebs by Dr. Donata Clay.  She had done well  following this surgery until this admission when she was readmitted with a spontaneous pneumothorax, predominantly inferior.  The upper lobe appeared adherent.  This was treated initially with a pigtail catheter; however, on each attempt to remove the  catheter, the patient developed persistent, though small air leak.  Because of the continued air leak, redo surgery on the right chest was recommended to her and she agreed and signed informed consent.  DESCRIPTION OF PROCEDURE:  The patient underwent general endotracheal anesthesia by Dr. Chilton Si without difficulty.  A double-lumen endotracheal tube was placed.  Appropriate timeout was performed and we proceeded with video bronchoscopy.  There were no  endobronchial lesions noted.  We did readjust the endotracheal tube to ensure proper ventilation of the right main stem bronchus.  The patient was then turned in lateral decubitus position with the right side up.  Right chest was prepped with Betadine,  draped in a sterile manner.  A second timeout was performed.   Port  placement were marked taking into consideration the primary problem appeared to be lower in the chest and she had previous adhesions.  The right chest was prepped with  Betadine, draped in a sterile manner.  A second timeout was performed.  Approximately the midaxillary line in the 6th intercostal space was infiltrated with Exparel.  Then, using a 5 mm 0-degree scope through Optiview port attempted to enter the pleural  space.  As we went some distance, it was apparent that there were numerous adhesions in this area, so we had to abandon this site and moved more inferior and posterior.  With this attempt, we were able to enter into an area of free pleural space and  started low level of insufflation to help further collapse of the right lung.  Using this site, we could easily identify the diaphragm.  With direct vision, a more anterior cannulation site was identified that was free of adhesions.  At this site, we  placed a 12 mm port with the port and camera.  Using EndoShears, we were able to take down some adhesions to allow to place a more medial camera port.  During this, we were able to identify an obvious moderate-sized blebs along the edge of the right  lower lobe.  With a 3rd port in, we then moved the [employer] robot into position, attached the most medial port to the camera.  Targeting the instruments and camera, we looked down toward the diaphragm rather than up.  With the arms docked and a  fenestrated tip-up grasper and a Cadiere, we placed on the operating arms working in the posterior pleural space.  There were areas of multiple nodularity. Did not appear to be endometrial implants.   This  was thought to be possible reaction to previous talc placement. To  be sure we to dissected  a portion of implants up  and brought bx out through the 12 mm port anterior port.  Frozen section confirmed that this was an inflammatory process, possibly related to previous talc and no identified malignancy or  endometrial implants .  We were then able to free the lower lobe and placed  a single blue load stapler through our more anterior 12 mm port and staple across the lower edge of the right lower lobe including the identified bleb.  This was then removed through the port site.  With the degree of a large reaction and adhesions in  the upper part of the chest, we did not do apical pleurectomy.  At this point, the robot was undocked and using a camera and a more anterior port, a mixture of piperocaine, Exparel and saline were injected along the posterior intercostal nerves as a  postoperative release of the chest wall discomfort.  A single 28 Blake chest tube was placed through the most anterior port and positioned posteriorly.  The lung was reinflated.  The other ports were closed with UR-6 sutures and a 3-0 subcuticular stitch  and Dermabond was applied.  The lung reinflated nicely with very minimal air leak.  The patient was awakened in the operating room, extubated and transferred to the recovery room.  Estimated blood loss less than 50 mL.  Sponge and needle count was  reported as correct at completion of the procedure.  The patient tolerated the procedure without obvious complication and was transferred to the recovery room.  IN/NUANCE  D:04/14/2020 T:04/15/2020 JOB:[id number]

## 2020-04-15 NOTE — Progress Notes (Addendum)
      [address]       [place] [address]             [phone]       1 Day Post-Op Procedure(s) (LRB): XI ROBOTIC ASSISTED THORASCOPY, RIGHT LOWER LOBE BLEB STAPLING AND RIGHT PLEURAL BIOPSY (Right) VIDEO BRONCHOSCOPY (Right) INTERCOSTAL NERVE BLOCK (Right)  Subjective: Patient with back pain this am;she thinks from sleeping position  Patient states incisional,chest pain well controlled this am.  Objective: Vital signs in last 24 hours: Temp:  [97 F (36.1 C)-98.1 F (36.7 C)] 97.8 F (36.6 C) (11/18 0320) Pulse Rate:  [60-75] 60 (11/18 0320) Cardiac Rhythm: Normal sinus rhythm (11/17 1926) Resp:  [11-19] 17 (11/18 0320) BP: (99-171)/(60-97) 99/60 (11/18 0320) SpO2:  [94 %-100 %] 96 % (11/18 0320) Arterial Line BP: (166-180)/(85-91) 166/85 (11/17 1815) Weight:  [62.6 kg] 62.6 kg (11/17 1219)     Intake/Output from previous day: 11/17 0701 - 11/18 0700 In: 2000 [I.V.:2000] Out: 1706 [Urine:1200; Blood:250; Chest Tube:256]   Physical Exam:  Cardiovascular: RRR Pulmonary: Clear to auscultation bilaterally Abdomen: Soft, non tender, bowel sounds present. Extremities: Mild bilateral lower extremity edema. Wounds: Clean and dry.  No erythema or signs of infection. Chest Tube: to suction; rare, intermittent +1 air leak with cough   Lab Results: TDV:VOHYWV Labs    04/14/20 0934 04/14/20 1924  WBC 4.3 9.5  HGB 12.5 12.3  HCT 38.2 37.2  PLT 225 207   BMET:  Recent Labs    04/14/20 0934 04/15/20 0047  NA 138 136  K 3.9 4.4  CL 104 104  CO2 26 26  GLUCOSE 91 130*  BUN 15 11  CREATININE 0.75 0.78  CALCIUM 8.9 8.3*    PT/INR:  Recent Labs    04/14/20 0934  LABPROT 12.9  INR 1.0   ABG:  INR: Will add last result for INR, ABG once components are confirmed Will add last 4 CBG results once components are confirmed  Assessment/Plan:  1. CV - SR 2.  Pulmonary - Right chest tube is to suction and there is an air leak. Chest tube  with 256 cc of output since surgery. Right chest tube is to suction and there is a rare, intermittent +1 air leak with cough. CXR this am appears stable (no basilar pneumothorax). Hope to place chest tube to water seal soon. Encourage incentive spirometer. 3. Remove foley  Donielle M ZimmermanPA-C 04/15/2020,7:26 AM [phone]  No air leak on my exam , ct to water seal today. Follow up chest xray in am I have seen and examined Julio Sicks and agree with the above assessment  and plan.  Delight Ovens MD Beeper [phone] Office [phone] 04/15/2020 12:06 PM

## 2020-04-16 ENCOUNTER — Inpatient Hospital Stay (HOSPITAL_COMMUNITY): Payer: BC Managed Care – PPO

## 2020-04-16 DIAGNOSIS — E785 Hyperlipidemia, unspecified: Secondary | ICD-10-CM | POA: Diagnosis not present

## 2020-04-16 DIAGNOSIS — F419 Anxiety disorder, unspecified: Secondary | ICD-10-CM | POA: Diagnosis not present

## 2020-04-16 DIAGNOSIS — F32A Depression, unspecified: Secondary | ICD-10-CM | POA: Diagnosis not present

## 2020-04-16 DIAGNOSIS — J939 Pneumothorax, unspecified: Secondary | ICD-10-CM | POA: Diagnosis not present

## 2020-04-16 LAB — SURGICAL PATHOLOGY

## 2020-04-16 MED ORDER — POLYETHYLENE GLYCOL 3350 17 G PO PACK
17.0000 g | PACK | Freq: Every day | ORAL | Status: DC
  Administered 2020-04-16: 17 g via ORAL
  Filled 2020-04-16 (×2): qty 1

## 2020-04-16 NOTE — Progress Notes (Signed)
PT Cancellation Note  Patient Details Name: Christine Guerrero MRN: [id number] DOB: Jan 06, 1971   Cancelled Treatment:    Reason Eval/Treat Not Completed: PT screened, no needs identified, will sign off pt reports she feels pretty good, awaiting her chest tube to be removed. She reports she has been ambulatory independently around the unit and feels close to her baseline. Reports not needing PT evaluation. Will sign off for now. Please re consult if anything changes.    Blake Divine A Danett Palazzo 04/16/2020, 10:45 AM [place], PT, DPT [facility] Services Pager [phone] Office [phone]

## 2020-04-16 NOTE — Progress Notes (Addendum)
      [address]       [place] [address]             [phone]       2 Days Post-Op Procedure(s) (LRB): XI ROBOTIC ASSISTED THORASCOPY, RIGHT LOWER LOBE BLEB STAPLING AND RIGHT PLEURAL BIOPSY (Right) VIDEO BRONCHOSCOPY (Right) INTERCOSTAL NERVE BLOCK (Right)  Subjective: Patient without breathing complaints. She does have some back pain again this am  Objective: Vital signs in last 24 hours: Temp:  [97.9 F (36.6 C)-98.2 F (36.8 C)] 98.1 F (36.7 C) (11/19 0324) Pulse Rate:  [56-63] 61 (11/19 0324) Cardiac Rhythm: Normal sinus rhythm (11/18 1900) Resp:  [14-16] 16 (11/19 0324) BP: (92-116)/(54-77) 113/76 (11/19 0324) SpO2:  [96 %-99 %] 99 % (11/19 0324)     Intake/Output from previous day: 11/18 0701 - 11/19 0700 In: 240 [P.O.:240] Out: 108 [Chest Tube:108]   Physical Exam:  Cardiovascular: RRR Pulmonary: Clear to auscultation bilaterally Abdomen: Soft, non tender, bowel sounds present. Extremities: No LE edema Wounds: Dressing is clean and dry.  Chest Tube: to water seal;tidling with cough but no air leak  Lab Results: CBC: Recent Labs    04/14/20 0934 04/14/20 1924  WBC 4.3 9.5  HGB 12.5 12.3  HCT 38.2 37.2  PLT 225 207   BMET:  Recent Labs    04/14/20 0934 04/15/20 0047  NA 138 136  K 3.9 4.4  CL 104 104  CO2 26 26  GLUCOSE 91 130*  BUN 15 11  CREATININE 0.75 0.78  CALCIUM 8.9 8.3*    PT/INR:  Recent Labs    04/14/20 0934  LABPROT 12.9  INR 1.0   ABG:  INR: Will add last result for INR, ABG once components are confirmed Will add last 4 CBG results once components are confirmed  Assessment/Plan:  1. CV - SR 2.  Pulmonary - Right chest tube is to water seal and there is tidling with cough but no true air leak. Chest tube with 110 cc of output since surgery.  CXR this am shows tiny right basilar pneumothorax. Hope to remove chest tube soon. Encourage incentive spirometer. Check CXR in am.   Lelon Huh  [facility] 04/16/2020,7:22 AM [phone]  Came to see about remove of chest tube, patient just back from bathroom and having spasm  Type pain in back. No air leak Will repeat chest xray before removal of ct  I have seen and examined Julio Sicks and agree with the above assessment  and plan.  Delight Ovens MD Beeper [phone] Office [phone] 04/16/2020 11:03 AM

## 2020-04-16 NOTE — Discharge Instructions (Signed)
Robot-Assisted Thoracic Surgery, Care After This sheet gives you information about how to care for yourself after your procedure. Your health care provider may also give you more specific instructions. If you have problems or questions, contact your health care provider. What can I expect after the procedure? After the procedure, it is common to have:  Some pain and aches in the area of your surgical cuts (incisions).  Pain when breathing in (inhaling) and coughing.  Tiredness (fatigue).  Trouble sleeping.  Constipation. Follow these instructions at home: Medicines  Take over-the-counter and prescription medicines only as told by your health care provider.  If you were prescribed an antibiotic medicine, take it as told by your health care provider. Do not stop taking the antibiotic even if you start to feel better.  Talk with your health care provider about safe and effective ways to manage pain after your procedure. Pain management should fit your specific health needs.  Take prescription pain medicine before pain becomes severe. Relieving and controlling your pain will make breathing easier for you. Activity  Return to your normal activities as told by your health care provider. Ask your health care provider what activities are safe for you.  Do not lift anything that is heavier than 10 lb (4.5 kg), or the limit that you are told, until your health care provider says that it is safe.  Avoid sitting for a long time without moving. Get up and move around one or more times every few hours. Bathing  Do not take baths, swim, or use a hot tub until your health care provider approves. You may take showers. Incision care  Follow instructions from your health care provider about how to take care of your incision(s). Make sure you: ? Wash your hands with soap and water before you change your bandage (dressing). If soap and water are not available, use hand sanitizer. ? Change your  dressing as told by your health care provider. ? Leave stitches (sutures), skin glue, or adhesive strips in place. These skin closures may need to stay in place for 2 weeks or longer. If adhesive strip edges start to loosen and curl up, you may trim the loose edges. Do not remove adhesive strips completely unless your health care provider tells you to do that.  Check your incision area every day for signs of infection. Check for: ? Redness, swelling, or pain. ? Fluid or blood. ? Warmth. ? Pus or a bad smell. Driving  Ask your health care provider when it is safe for you to drive.  Do not drive or use heavy machinery while taking prescription pain medicine. Eating and drinking  Follow instructions from your health care provider about eating or drinking restrictions. These will vary depending on what procedure you had. Your health care provider may recommend: ? A liquid diet or soft diet for the first few days. ? Meals that are smaller and more frequent. ? A diet of fruits, vegetables, whole grains, and low-fat proteins. ? Limiting foods that are high in processed sugar and fat, including fried and sweet foods. Pneumonia prevention   Do not use any products that contain nicotine or tobacco, such as cigarettes and e-cigarettes. If you need help quitting, ask your health care provider.  Avoid secondhand smoke.  Do deep breathing exercises and cough regularly as directed. This helps to clear mucus and prevent pneumonia. If it hurts to cough, try one of these methods to ease your pain when you cough: ? Hold a   pillow against your chest. ? Place the palms of both hands over your incisions (use splinting).  Use an incentive spirometer as directed. This device measures how much air your lungs are getting with each breath. Using this will improve your breathing.  Do pulmonary rehabilitation as directed. This is a program that includes exercise, education, and support. General  instructions  Wear compression stockings as told by your health care provider. These stockings help to prevent blood clots and reduce swelling in your legs.  If you have a drainage tube: ? Follow instructions from your health care provider about how to take care of it. ? Do not travel by airplane after your tube is removed until your health care provider tells you it is safe.  To prevent or treat constipation while you are taking prescription pain medicine, your health care provider may recommend that you: ? Drink enough fluid to keep your urine pale yellow. ? Take over-the-counter or prescription medicines. ? Eat foods that are high in fiber, such as fresh fruits and vegetables, whole grains, and beans. ? Limit foods that are high in fat and processed sugars, such as fried and sweet foods.  Keep all follow-up visits as told by your health care provider. This is important. Contact a health care provider if:  You have redness, swelling, or pain around an incision.  You have fluid or blood coming from an incision.  An incision feels warm to the touch.  You have pus or a bad smell coming from an incision.  You have a fever.  You cannot eat or drink without vomiting.  Your prescription pain medicine is not controlling your pain. Get help right away if:  You have chest pain.  Your heart is beating quickly.  You have trouble breathing.  You have trouble speaking.  You are confused.  You feel weak or dizzy, or you faint. These symptoms may represent a serious problem that is an emergency. Do not wait to see if the symptoms will go away. Get medical help right away. Call your local emergency services (911 in the [place] Do not drive yourself to the hospital. Summary  Talk with your health care provider about safe and effective ways to manage pain after your procedure. Pain management should fit your specific health needs.  Return to your normal activities as told by your health  care provider. Ask your health care provider what activities are safe for you.  Do deep breathing exercises and cough regularly as directed. This helps to clear mucus and prevent pneumonia. If it hurts to cough, ease pain by holding a pillow against your chest or by placing the palms of both hands over your incisions (splinting). This information is not intended to replace advice given to you by your health care provider. Make sure you discuss any questions you have with your health care provider. Document Revised: 03/14/2019 Document Reviewed: 09/18/2016 Elsevier Patient Education  2020 [employer]  

## 2020-04-16 NOTE — Progress Notes (Signed)
Chest tube removed, Pt tolerated procedure with out incident.  Prior to removal, I was told by Charge Nurse, [place], Per Dr. Tyrone Sage, due the incision of the chest tube insertion, he wanted all the sutures to remain, the black suture tied down, and to place a petroleum gauze over the site.   This was completed, per instructed by charge Nurse, per Dr. Tyrone Sage.  Pt instructed to stay in bed for 1 hour, post procedure to prevent any air leakage.  And instructed to leave the bandage on for 2 days, due to the extra long cut, to give time for the incision to heal To prevent air leaking into chest cavity.

## 2020-04-16 NOTE — Plan of Care (Signed)
  Problem: Health Behavior/Discharge Planning: Goal: Ability to manage health-related needs will improve Outcome: Progressing   Problem: Clinical Measurements: Goal: Will remain free from infection Outcome: Progressing   Problem: Elimination: Goal: Will not experience complications related to bowel motility Outcome: Progressing   

## 2020-04-16 NOTE — Progress Notes (Signed)
PROGRESS NOTE    Christine Guerrero  DUK:[id number] DOB: 12-13-1970 DOA: 04/05/2020 PCP: Bernita Raisin., MD    Brief Narrative:  Christine Guerrero was admitted to the hospital with the working diagnosis of right spontaneous pneumothorax.   49 yo female with past medical history of prior right pneumothorax sp VATS and pleurodesis in 2018. Reported 2 weeks of dyspnea, worsening associated with chest pain.  On her initial physical examination her blood pressure was 185/96, HR 74, RR 28, oxygen saturation 97%. Her lungs had no wheezing, or rhonchi, s1 and s2 present and rhythmic, abdomen soft and no lower extremity edema.   Chest film with right pneumothorax  She failed chest tube managed pneumothorax and underwent VATS with right lower lobe bleb stapling on 11/17.    Assessment & Plan:   Principal Problem:   Pneumothorax on right Active Problems:   Depression   Dyslipidemia   Anxiety   Status post chest tube placement (HCC)    1. Right spontaneous pneumothorax, failed chest tube management, sp VTAS with right lower lobe bleb stapling and right pleural biopsy.  Chest tube drainage is down to 108 from 256 cc over last 24 hr. Follow up chest film with small right pneumothorax at the base of the right lower lobe. She has no air leak.  Oxymetry is 98% on room air.   Continue chest tube to water seal. Pain control with ketorolac, cyclobenzaprine, tramadol, diclofenac, acetaminophen and IV morphine.  Antitussive agents with tussionex.  Continue to encourage mobility, ambulate in the hallway, incentive spirometer. Follow with CT surgery recommendations. Chest film in am.   2. Dyslipidemia. On ezetimibe.   3. Depression and anxiety. On sertraline and as needed lorazepam.    Patient continue to be at high risk for worsening pneumothorax.   Status is: Inpatient  Remains inpatient appropriate because:IV treatments appropriate due to intensity of illness or inability to take  PO   Dispo: The patient is from: Home              Anticipated d/c is to: Home              Anticipated d/c date is: 3 days              Patient currently is not medically stable to d/c.    DVT prophylaxis: Enoxaparin   Code Status:   full  Family Communication:  No family at the bedside    Consultants:   IR  CT surgery   Procedures:   Chest tube right side  VATS    Subjective: Patient had severe pain yesterday at the right side, improved with cyclobenzaprine, this am her pain is better controlled, no nausea or vomiting, no significant dyspnea or cough.   Objective: Vitals:   04/15/20 1610 04/15/20 1908 04/15/20 2356 04/16/20 0324  BP: 113/77 116/65 (!) 92/54 113/76  Pulse: (!) 56 63 60 61  Resp: 14 15 15 16   Temp: 97.9 F (36.6 C) 98.2 F (36.8 C) 98.1 F (36.7 C) 98.1 F (36.7 C)  TempSrc: Oral Oral Oral Oral  SpO2: 99% 97% 96% 99%  Weight:      Height:        Intake/Output Summary (Last 24 hours) at 04/16/2020 0834 Last data filed at 04/16/2020 04/18/2020 Gross per 24 hour  Intake 240 ml  Output 108 ml  Net 132 ml   Filed Weights   04/05/20 1544 04/14/20 1219  Weight: 62.6 kg 62.6 kg    Examination:  General: Not in pain or dyspnea.  Neurology: Awake and alert, non focal  E ENT: no pallor, no icterus, oral mucosa moist Cardiovascular: No JVD. S1-S2 present, rhythmic, no gallops, rubs, or murmurs. No lower extremity edema. Pulmonary: positive breath sounds bilaterally,  no wheezing, rhonchi or rales. Chest tube on the right.  Gastrointestinal. Abdomen soft and non tender.  Skin. No rashes Musculoskeletal: no joint deformities No air leak noted.     Data Reviewed: I have personally reviewed following labs and imaging studies  CBC: Recent Labs  Lab 04/10/20 0931 04/14/20 0934 04/14/20 1924  WBC 4.2 4.3 9.5  NEUTROABS 1.6*  --  8.6*  HGB 12.4 12.5 12.3  HCT 37.9 38.2 37.2  MCV 89.6 89.5 89.6  PLT 219 225 207   Basic Metabolic  Panel: Recent Labs  Lab 04/10/20 0931 04/14/20 0934 04/15/20 0047  NA 135 138 136  K 4.1 3.9 4.4  CL 101 104 104  CO2 27 26 26   GLUCOSE 81 91 130*  BUN 12 15 11   CREATININE 0.83 0.75 0.78  CALCIUM 9.0 8.9 8.3*  MG 1.9  --   --    GFR: Estimated Creatinine Clearance: 79.6 mL/min (by C-G formula based on SCr of 0.78 mg/dL). Liver Function Tests: Recent Labs  Lab 04/14/20 0934  AST 24  ALT 32  ALKPHOS 45  BILITOT 0.4  PROT 6.0*  ALBUMIN 3.6   No results for input(s): LIPASE, AMYLASE in the last 168 hours. No results for input(s): AMMONIA in the last 168 hours. Coagulation Profile: Recent Labs  Lab 04/14/20 0934  INR 1.0   Cardiac Enzymes: No results for input(s): CKTOTAL, CKMB, CKMBINDEX, TROPONINI in the last 168 hours. BNP (last 3 results) No results for input(s): PROBNP in the last 8760 hours. HbA1C: No results for input(s): HGBA1C in the last 72 hours. CBG: Recent Labs  Lab 04/14/20 0854  GLUCAP 92   Lipid Profile: No results for input(s): CHOL, HDL, LDLCALC, TRIG, CHOLHDL, LDLDIRECT in the last 72 hours. Thyroid Function Tests: No results for input(s): TSH, T4TOTAL, FREET4, T3FREE, THYROIDAB in the last 72 hours. Anemia Panel: No results for input(s): VITAMINB12, FOLATE, FERRITIN, TIBC, IRON, RETICCTPCT in the last 72 hours.    Radiology Studies: I have reviewed all of the imaging during this hospital visit personally     Scheduled Meds: . bupivacaine liposome  20 mL Infiltration Once  . Chlorhexidine Gluconate Cloth  6 each Topical Daily  . diclofenac  75 mg Oral Q breakfast  . estradiol  1 mg Oral Daily  . ezetimibe  10 mg Oral QPM  . influenza vac split quadrivalent PF  0.5 mL Intramuscular Tomorrow-1000  . ketorolac  15 mg Intravenous Q6H  . loratadine  10 mg Oral Daily  . mupirocin ointment  1 application Nasal BID  . pantoprazole  40 mg Oral Daily  . sertraline  25 mg Oral Daily   Continuous Infusions:   LOS: 11 days         Tomiko Schoon 04/16/20, MD

## 2020-04-17 ENCOUNTER — Inpatient Hospital Stay (HOSPITAL_COMMUNITY): Payer: BC Managed Care – PPO

## 2020-04-17 NOTE — Discharge Summary (Signed)
Physician Discharge Summary  Christine Guerrero ZHG:[id number] DOB: [birth year] DOA: 04/05/2020  PCP: Bernita Raisin., MD  Admit date: 04/05/2020 Discharge date: 04/17/2020  Admitted From: Home  Disposition:  Home   Recommendations for Outpatient Follow-up and new medication changes:  1. Follow up with Dr. Donnie Mesa in 7 days.  2. Follow up with CT surgery as scheduled.   Home Health: no   Equipment/Devices: no    Discharge Condition: stable  CODE STATUS: full  Diet recommendation: regular.   Brief/Interim Summary: Christine Guerrero was admitted to the hospital with the working diagnosis of right spontaneous pneumothorax.   49 yo female with past medical history of prior right pneumothorax sp VATS and pleurodesis in 2018. Reported 2 weeks of dyspnea, worsening associated with chest pain. On her initial physical examination her blood pressure was 185/96, HR 74, RR 28, oxygen saturation 97%. Her lungs had no wheezing, or rhonchi, heart with s1 and s2 present and rhythmic, abdomen soft and no lower extremity edema.  Sodium 139, potassium 3.5, chloride 104, bicarb 25, glucose 83, BUN 14, creatinine 0.75, white count 5.4, hemoglobin 13.0, hematocrit 40.1, platelets 231.  SARS COVID-19 negative. EKG 63 bpm, normal axis, normal intervals, sinus rhythm, no ST segment or T wave changes, positive LVH.  Chest radiograph with large right basal pneumothorax, CT chest with large primarily subpulmonic right pneumothorax, volume estimated 50%.  Minimal mediastinal shift to the left.  She initially had a chest tube placed on 04/06/2030.she had right lung reexpansion but persistent air leak.  It was very likely that patient will have recurrence of her disease if no further surgical intervention. Patient underwent VATS with right lower lobe bleb stapling on 11/17.   1.  Right spontaneous pneumothorax, refractive to chest tube.  S/p VATS with right lower lobe bleb stapling and right pleural biopsy. Patient  underwent procedure with no major complications, her chest tube was initially to suction then to waterseal, no further air leak.  Her chest tube was removed on 11/19 with good toleration.  Her chest radiograph on the day of discharge has complete right lung expansion with no recurrence of pneumothorax.  2.  Dyslipidemia.  Continue ezetimibe.  3.  Depression/anxiety.  Continue sertraline and lorazepam.   Discharge Diagnoses:  Principal Problem:   Pneumothorax on right Active Problems:   Depression   Dyslipidemia   Anxiety   Status post chest tube placement [facility])    Discharge Instructions   Allergies as of 04/17/2020      Reactions   Codeine Nausea And Vomiting      Medication List    TAKE these medications   diclofenac 50 MG tablet Commonly known as: CATAFLAM Take 50 mg by mouth daily.   estradiol 1 MG tablet Commonly known as: ESTRACE Take 1 tablet by mouth daily.   ezetimibe 10 MG tablet Commonly known as: ZETIA Take 10 mg by mouth every evening.   fexofenadine 180 MG tablet Commonly known as: ALLEGRA Take 1 tablet (180 mg total) by mouth daily.   LORazepam 0.5 MG tablet Commonly known as: ATIVAN Take 0.5 mg by mouth daily as needed for anxiety.   omega-3 acid ethyl esters 1 g capsule Commonly known as: LOVAZA Take 1 g by mouth 2 (two) times daily.   omeprazole 20 MG capsule Commonly known as: PRILOSEC Take 20 mg by mouth in the morning.   sertraline 25 MG tablet Commonly known as: ZOLOFT Take 25 mg by mouth daily.       Follow-up  Information    Delight Ovens, MD. Go on 04/29/2020.   Specialty: Cardiothoracic Surgery Why: PA/LAT to be taken (at [facility] which is in the same building as Dr. Dennie Maizes office) on 12/02 at 9:00 am; Appointment time is at 9:30 am Contact information: [address] [employer] [address] [place] [address] [phone]              Allergies  Allergen Reactions  . Codeine Nausea And Vomiting     Consultations:  Cardiothoracic surgery   IR    Procedures/Studies: DG Chest 1 View  Result Date: 04/06/2020 CLINICAL DATA:  Chest tube. EXAM: CHEST  1 VIEW COMPARISON:  April 05, 2020. FINDINGS: The heart size and mediastinal contours are within normal limits. Left lung is clear. Interval placement of right basilar chest tube. No pneumothorax is noted currently. Postsurgical changes are noted in the right upper lobe. Mild right basilar atelectasis or infiltrate is noted with small pleural effusion. The visualized skeletal structures are unremarkable. IMPRESSION: Interval placement of right basilar chest tube. No pneumothorax is noted currently. Mild right basilar atelectasis or infiltrate is noted with small pleural effusion. Electronically Signed   By: Lupita Raider M.D.   On: 04/06/2020 17:49   DG Chest 2 View  Result Date: 04/05/2020 CLINICAL DATA:  49 year old female with chest pain. EXAM: CHEST - 2 VIEW COMPARISON:  Chest radiograph dated 02/21/2017. FINDINGS: There is background of emphysema. Postsurgical changes of the right upper lobe with surgical suture. There is a large area of pneumothorax at the right lung base. No focal consolidation or pleural effusion. The cardiac silhouette is within limits. No acute osseous pathology. Breast implant noted. IMPRESSION: Large right base pneumothorax. These results were called by telephone at the time of interpretation on 04/05/2020 at 4:27 pm to Dr. Jacqulyn Bath, who verbally acknowledged these results. Electronically Signed   By: Elgie Collard M.D.   On: 04/05/2020 16:30   CT Chest Wo Contrast  Result Date: 04/05/2020 CLINICAL DATA:  Short of breath, right pneumothorax EXAM: CT CHEST WITHOUT CONTRAST TECHNIQUE: Multidetector CT imaging of the chest was performed following the standard protocol without IV contrast. COMPARISON:  04/05/2020, 09/21/2016 FINDINGS: Cardiovascular: Unenhanced imaging of the heart demonstrates trace pericardial fluid.  Normal caliber of the thoracic aorta. No significant atherosclerosis. Mediastinum/Nodes: No enlarged mediastinal or axillary lymph nodes. Thyroid gland, trachea, and esophagus demonstrate no significant findings. Lungs/Pleura: There is a large right pneumothorax, primarily subpulmonic in location. Numerous pleural adhesions are seen throughout the right hemithorax. Volume of pneumothorax estimated 50%. There is mild mediastinal shift to the left. Postsurgical changes are seen at the right apex. Compressive atelectasis is seen within the right lower lobe. No right pleural effusion. The left lung is clear.  Central airways are patent. Upper Abdomen: No acute abnormality. Musculoskeletal: No acute or destructive bony lesions. Reconstructed images demonstrate no additional findings. IMPRESSION: 1. Large primarily sub pulmonic right pneumothorax, volume estimated 50%. Minimal mediastinal shift to the left. 2. Postsurgical changes at the right apex. Multiple pleural adhesions in the right pleural space likely reflect previous attempted pleurodesis. Electronically Signed   By: Sharlet Salina M.D.   On: 04/05/2020 20:33   DG Chest Port 1 View  Result Date: 04/16/2020 CLINICAL DATA:  Sudden onset of right posterior chest pain. Chest tube. EXAM: PORTABLE CHEST 1 VIEW COMPARISON:  One-view chest x-ray 04/16/2020 at 5:16 a.m. FINDINGS: Heart is enlarged. Right-sided chest tube is in place. No pneumothorax is present. Right lower lobe opacity  noted. There is no significant interval change. IMPRESSION: 1. Stable right-sided chest tube without pneumothorax. 2. Stable right lower lobe opacity. Electronically Signed   By: Marin Robertshristopher  Mattern M.D.   On: 04/16/2020 12:37   DG CHEST PORT 1 VIEW  Result Date: 04/16/2020 CLINICAL DATA:  Pneumothorax EXAM: PORTABLE CHEST 1 VIEW COMPARISON:  04/15/2020. FINDINGS: Right chest tube in stable position. Tiny right base pneumothorax again noted on today's exam. Surgical sutures right  lung. Persistent atelectasis/infiltrate right lung base. Tiny left pleural effusion. Heart size stable. IMPRESSION: 1. Right chest tube in stable position. Tiny right base pneumothorax again noted on today's exam. 2. Persistent atelectasis/infiltrate right lung base. Tiny left pleural effusion. Electronically Signed   By: Maisie Fushomas  Register   On: 04/16/2020 05:56   DG CHEST PORT 1 VIEW  Result Date: 04/15/2020 CLINICAL DATA:  Pneumothorax.  Chest tube. EXAM: PORTABLE CHEST 1 VIEW COMPARISON:  04/14/2020. FINDINGS: Right chest tube in stable position. No pneumothorax. Surgical sutures noted over the right lung. Persistent right base infiltrate. No pleural effusion. Stable cardiomegaly. Scratch heart size stable. No pulmonary venous congestion. Thoracic spine scoliosis. Bilateral breast implants. IMPRESSION: 1. Right chest tube in stable position. No pneumothorax. 2. Surgical sutures noted over the right lung. Persistent right base infiltrate. Electronically Signed   By: Maisie Fushomas  Register   On: 04/15/2020 07:43   DG Chest Port 1 View  Result Date: 04/14/2020 CLINICAL DATA:  Pneumothorax EXAM: PORTABLE CHEST 1 VIEW COMPARISON:  04/14/2020 FINDINGS: Interval replacement of the pigtail pleural catheter with a chest tube. Re-expansion of the right lung. No visible pneumothorax. Bilateral lower lobe atelectasis or infiltrates, new since prior study. Heart is borderline in size. Postoperative changes on the right. IMPRESSION: Placement of large bore chest tube on the right with resolution of the previously seen right pneumothorax. Bibasilar atelectasis or infiltrates. Electronically Signed   By: Charlett NoseKevin  Dover M.D.   On: 04/14/2020 19:09   DG CHEST PORT 1 VIEW  Result Date: 04/14/2020 CLINICAL DATA:  Pneumothorax EXAM: PORTABLE CHEST 1 VIEW COMPARISON:  04/13/2020 FINDINGS: The lungs appear mildly hyperinflated, similar to that noted on prior examination. Surgical staple line noted at the right lung apex. Right  basilar pigtail chest tube is unchanged in position. Small right basilar pneumothorax has increased in size. Lungs are clear. No pneumothorax or pleural effusion on the left. Cardiac size within normal limits. No acute bone abnormality. IMPRESSION: Enlarging right basilar pneumothorax. Right chest tube in place. Correlation for right chest tube clamping or malfunction is recommended. Electronically Signed   By: Helyn NumbersAshesh  Parikh MD   On: 04/14/2020 06:36   DG CHEST PORT 1 VIEW  Result Date: 04/13/2020 CLINICAL DATA:  Pneumothorax.  Chest tube. EXAM: PORTABLE CHEST 1 VIEW COMPARISON:  04/12/2020. FINDINGS: Right chest tube in stable position. No definite pneumothorax noted on today's exam. Mediastinum and hilar structures normal. Postsurgical changes right upper lung. Lungs are clear. No pleural effusion. Heart size normal. Bilateral breast implants. Mild thoracic spine scoliosis. IMPRESSION: Right chest tube in stable position. No definite pneumothorax noted on today's exam. Postsurgical changes right upper lung. Electronically Signed   By: Maisie Fushomas  Register   On: 04/13/2020 06:40   DG Chest Port 1 View  Result Date: 04/12/2020 CLINICAL DATA:  Spontaneous pneumothorax EXAM: PORTABLE CHEST 1 VIEW COMPARISON:  04/11/2020 FINDINGS: The lungs appear mildly hyperinflated, unchanged from prior examination. Right basilar pigtail chest tube is unchanged in position. Small right basilar pneumothorax appears to have slightly enlarged in the interval  since prior examination. No significant mediastinal shift or asymmetric depression of the right hemidiaphragm to suggest tension physiology. Surgical staple line noted at the right apex. No superimposed focal pulmonary infiltrate. No pneumothorax or pleural effusion on the left. Cardiac size within normal limits. IMPRESSION: Small but enlarging right basilar pneumothorax. Right pigtail chest tube in place. Stable pulmonary hyperinflation. Electronically Signed   By: Helyn Numbers MD   On: 04/12/2020 06:45   DG Chest Port 1 View  Result Date: 04/11/2020 CLINICAL DATA:  Pneumothorax EXAM: PORTABLE CHEST 1 VIEW COMPARISON:  April 10, 2020 FINDINGS: The cardiomediastinal silhouette is unchanged in contour.Status post RIGHT lung surgery. RIGHT chest tube in place. No pleural effusion. No significant pneumothorax; there may be a trace RIGHT basilar pneumothorax. No acute pleuroparenchymal abnormality. Visualized abdomen is unremarkable. RIGHT-sided subcutaneous air. No acute osseous abnormality. IMPRESSION: RIGHT chest tube in place. No significant pneumothorax; there may be a trace residual RIGHT basilar pneumothorax. Electronically Signed   By: Meda Klinefelter MD   On: 04/11/2020 10:22   DG Chest Port 1 View  Result Date: 04/10/2020 CLINICAL DATA:  Chest tube placement. Postoperative change right lung EXAM: PORTABLE CHEST 1 VIEW COMPARISON:  April 09, 2020 FINDINGS: Chest tube unchanged in position on the right. No appreciable pneumothorax. Postoperative change right upper lobe. Mild atelectasis right upper lobe region. No edema or airspace opacity. Heart is upper normal in size with pulmonary vascularity normal. No adenopathy. No bone lesions. IMPRESSION: Postoperative change on the right. Chest tube present on the right without appreciable pneumothorax. No edema or consolidation. Stable cardiac silhouette. Electronically Signed   By: Bretta Bang III M.D.   On: 04/10/2020 08:23   DG CHEST PORT 1 VIEW  Result Date: 04/09/2020 CLINICAL DATA:  Follow-up right-sided pneumothorax. EXAM: PORTABLE CHEST 1 VIEW COMPARISON:  Chest x-ray 04/08/2020 FINDINGS: The right basilar chest tube is stable. No definite right-sided pneumothorax. Stable surgical changes involving the right upper lobe. No pleural effusions. No pulmonary infiltrates. IMPRESSION: Stable right basilar chest tube without definite pneumothorax. Electronically Signed   By: Rudie Meyer M.D.   On:  04/09/2020 07:11   DG CHEST PORT 1 VIEW  Result Date: 04/08/2020 CLINICAL DATA:  Pneumothorax EXAM: PORTABLE CHEST 1 VIEW COMPARISON:  Yesterday FINDINGS: Right base chest tube. Stable hazy density at the right base and sutures at the right upper lobe. No visible pneumothorax or pleural effusion. Normal heart size and mediastinal contours. IMPRESSION: No visible pneumothorax. Electronically Signed   By: Marnee Spring M.D.   On: 04/08/2020 04:10   DG Chest Port 1 View  Result Date: 04/07/2020 CLINICAL DATA:  Follow-up pneumothorax. EXAM: PORTABLE CHEST 1 VIEW COMPARISON:  Chest x-ray 04/06/2020 FINDINGS: The right basilar chest tube is stable. No residual pneumothorax is identified. Improving right basilar lung aeration. The left lung remains clear. IMPRESSION: 1. Stable right basilar chest tube.  No residual pneumothorax. 2. Improving aeration at the right lung base with resolving atelectasis. Electronically Signed   By: Rudie Meyer M.D.   On: 04/07/2020 07:47   CT [facility] PLEURAL DRAIN W/INDWELL CATH W/IMG GUIDE  Result Date: 04/06/2020 INDICATION: 49 year old female with recurrent spontaneous right-sided pneumothorax. She presents for chest tube placement. EXAM: CT-guided chest tube placement MEDICATIONS: None. ANESTHESIA/SEDATION: Fentanyl 100 mcg IV; Versed 2 mg IV Moderate Sedation Time:  16 minutes The patient was continuously monitored during the procedure by the interventional radiology nurse under my direct supervision. COMPLICATIONS: None immediate. PROCEDURE: Informed written consent was obtained from  the patient after a thorough discussion of the procedural risks, benefits and alternatives. All questions were addressed. Maximal Sterile Barrier Technique was utilized including caps, mask, sterile gowns, sterile gloves, sterile drape, hand hygiene and skin antiseptic. A timeout was performed prior to the initiation of the procedure. A planning axial CT scan was performed. The large  right-sided pneumothorax was successfully identified. A suitable skin entry site over a right lower rib was identified. The overlying skin was sterilely prepped and draped in the standard fashion using chlorhexidine skin prep. Local anesthesia was attained by infiltration with 1% lidocaine. A small dermatotomy was made. Using trocar technique, a 12 French pigtail thoracostomy tube was advanced over the rib and into the pleural space. The catheter was connected to low wall suction via an atrium. Follow-up chest CT imaging demonstrates good placement of the tube and successful re-expansion of the lung. The catheter was secured in place with 0 Prolene suture and sterile bandages were applied. IMPRESSION: Successful placement of a 12 French pigtail thoracostomy tube on the right with resolution of spontaneous pneumothorax. Electronically Signed   By: Malachy Moan M.D.   On: 04/06/2020 15:42      Procedures: Right chest tube                        Right VATS with blebs stapling and pleural biopsy   Subjective: Patient is feeling well, the chest tube has been removed and her pain is well controlled with analgesics. No nausea or vomiting.   Discharge Exam: Vitals:   04/17/20 0353 04/17/20 0722  BP: 122/81 126/80  Pulse: (!) 51 65  Resp: 14 14  Temp: 98.1 F (36.7 C) 98.1 F (36.7 C)  SpO2: 95% 98%   Vitals:   04/16/20 2315 04/17/20 0300 04/17/20 0353 04/17/20 0722  BP: (!) 96/57 (!) 93/52 122/81 126/80  Pulse: 62 60 (!) 51 65  Resp: Temp: 98.1 F (36.7 C) 98.1 F (36.7 C) 98.1 F (36.7 C) 98.1 F (36.7 C)  TempSrc: Oral Oral Oral Oral  SpO2: 96% 97% 95% 98%  Weight:      Height:        General: Not in pain or dyspnea  Neurology: Awake and alert, non focal  E ENT: no pallor, no icterus, oral mucosa moist Cardiovascular: No JVD. S1-S2 present, rhythmic, no gallops, rubs, or murmurs. No lower extremity edema. Pulmonary: positive breath sounds bilaterally, with no  wheezing, rhonchi or rales. Gastrointestinal. Abdomen soft and non tender Skin. No rashes Musculoskeletal: no joint deformities   The results of significant diagnostics from this hospitalization (including imaging, microbiology, ancillary and laboratory) are listed below for reference.     Microbiology: Recent Results (from the past 240 hour(s))  Surgical PCR screen     Status: None   Collection Time: 04/13/20  6:45 PM   Specimen: Nasal Mucosa; Nasal Swab  Result Value Ref Range Status   MRSA, PCR NEGATIVE NEGATIVE Final   Staphylococcus aureus NEGATIVE NEGATIVE Final    Comment: (NOTE) The Xpert SA Assay (FDA approved for NASAL specimens in patients 54 years of age and older), is one component of a comprehensive surveillance program. It is not intended to diagnose infection nor to guide or monitor treatment. Performed at [facility], [address], [place], [place] [address]      Labs: BNP (last 3 results) No results for input(s): BNP in the last 8760 hours. Basic Metabolic Panel:  Recent Labs  Lab 04/10/20 0931 04/14/20 0934 04/15/20 0047  NA 135 138 136  K 4.1 3.9 4.4  CL 101 104 104  CO2 27 26 26   GLUCOSE 81 91 130*  BUN 12 15 11   CREATININE 0.83 0.75 0.78  CALCIUM 9.0 8.9 8.3*  MG 1.9  --   --    Liver Function Tests: Recent Labs  Lab 04/14/20 0934  AST 24  ALT 32  ALKPHOS 45  BILITOT 0.4  PROT 6.0*  ALBUMIN 3.6   No results for input(s): LIPASE, AMYLASE in the last 168 hours. No results for input(s): AMMONIA in the last 168 hours. CBC: Recent Labs  Lab 04/10/20 0931 04/14/20 0934 04/14/20 1924  WBC 4.2 4.3 9.5  NEUTROABS 1.6*  --  8.6*  HGB 12.4 12.5 12.3  HCT 37.9 38.2 37.2  MCV 89.6 89.5 89.6  PLT 219 225 207   Cardiac Enzymes: No results for input(s): CKTOTAL, CKMB, CKMBINDEX, TROPONINI in the last 168 hours. BNP: Invalid input(s): POCBNP CBG: Recent Labs  Lab 04/14/20 0854  GLUCAP 92   D-Dimer No results for input(s):  DDIMER in the last 72 hours. Hgb A1c No results for input(s): HGBA1C in the last 72 hours. Lipid Profile No results for input(s): CHOL, HDL, LDLCALC, TRIG, CHOLHDL, LDLDIRECT in the last 72 hours. Thyroid function studies No results for input(s): TSH, T4TOTAL, T3FREE, THYROIDAB in the last 72 hours.  Invalid input(s): FREET3 Anemia work up No results for input(s): VITAMINB12, FOLATE, FERRITIN, TIBC, IRON, RETICCTPCT in the last 72 hours. Urinalysis    Component Value Date/Time   COLORURINE YELLOW 01/21/2017 1749   APPEARANCEUR CLEAR 01/21/2017 1749   LABSPEC 1.017 01/21/2017 1749   PHURINE 5.0 01/21/2017 1749   GLUCOSEU NEGATIVE 01/21/2017 1749   HGBUR NEGATIVE 01/21/2017 1749   BILIRUBINUR NEGATIVE 01/21/2017 1749   KETONESUR NEGATIVE 01/21/2017 1749   PROTEINUR NEGATIVE 01/21/2017 1749   NITRITE NEGATIVE 01/21/2017 1749   LEUKOCYTESUR NEGATIVE 01/21/2017 1749   Sepsis Labs Invalid input(s): PROCALCITONIN,  WBC,  LACTICIDVEN Microbiology Recent Results (from the past 240 hour(s))  Surgical PCR screen     Status: None   Collection Time: 04/13/20  6:45 PM   Specimen: Nasal Mucosa; Nasal Swab  Result Value Ref Range Status   MRSA, PCR NEGATIVE NEGATIVE Final   Staphylococcus aureus NEGATIVE NEGATIVE Final    Comment: (NOTE) The Xpert SA Assay (FDA approved for NASAL specimens in patients 11 years of age and older), is one component of a comprehensive surveillance program. It is not intended to diagnose infection nor to guide or monitor treatment. Performed at [facility], [address] [place], [address] [place]      Time coordinating discharge: 45 minutes  SIGNED:   [place], MD  [facility] 04/17/2020, 9:04 AM

## 2020-04-17 NOTE — Progress Notes (Addendum)
      [address]       [place] [address]             [phone]       3 Days Post-Op Procedure(s) (LRB): XI ROBOTIC ASSISTED THORASCOPY, RIGHT LOWER LOBE BLEB STAPLING AND RIGHT PLEURAL BIOPSY (Right) VIDEO BRONCHOSCOPY (Right) INTERCOSTAL NERVE BLOCK (Right)  Subjective:  Feels good, no complaints.  Pain well controlled.  No shortness of breath.  She is anxious to go home.  Objective: Vital signs in last 24 hours: Temp:  [98.1 F (36.7 C)-98.5 F (36.9 C)] 98.1 F (36.7 C) (11/20 0722) Pulse Rate:  [51-67] 65 (11/20 0722) Cardiac Rhythm: Normal sinus rhythm (11/20 0721) Resp:  [14-20] 14 (11/20 0722) BP: (93-126)/(52-81) 126/80 (11/20 0722) SpO2:  [95 %-99 %] 98 % (11/20 0722)     Intake/Output from previous day: 11/19 0701 - 11/20 0700 In: 240 [P.O.:240] Out: -    Physical Exam:  Cardiovascular: RRR Pulmonary: Breath sounds are clear Wounds: Dressing is clean and dry.   Lab Results: CBC: Recent Labs    04/14/20 1924  WBC 9.5  HGB 12.3  HCT 37.2  PLT 207   BMET:  Recent Labs    04/15/20 0047  NA 136  K 4.4  CL 104  CO2 26  GLUCOSE 130*  BUN 11  CREATININE 0.78  CALCIUM 8.3*     EXAM: CHEST - 2 VIEW  COMPARISON:  April 16, 2020  FINDINGS: The right chest tube is been removed. No pneumothorax. Surgical changes in the right apex are stable. Opacity in the periphery of the right lower lung is stable. The cardiomediastinal silhouette is unchanged. Minimal atelectasis in the left base. The left lung is otherwise normal. No other acute abnormalities.  IMPRESSION: 1. Interval removal of right chest tube without pneumothorax. No other changes. Persistent opacity in the periphery of the right lower lung with a probable small effusion. Persistent mild atelectasis in the left base. Persistent postsurgical changes in the right apex.   Electronically Signed   By: Gerome Sam III M.D   On: 04/17/2020  10:00   Assessment/Plan:  1. CV - SR 2.  Pulmonary - Right chest tube removed yesterday.  Follow-up CXR this am is stable with no pneumothorax.  Plan discharged home today, follow-up with Dr. Nydia Bouton has been arranged for 04-29-2020.  Cecille Amsterdam RoddenberryPA-C 04/17/2020,11:45 AM [phone]

## 2020-04-28 ENCOUNTER — Other Ambulatory Visit: Payer: Self-pay | Admitting: Cardiothoracic Surgery

## 2020-04-28 DIAGNOSIS — J939 Pneumothorax, unspecified: Secondary | ICD-10-CM

## 2020-04-28 NOTE — Progress Notes (Signed)
[address]       [place] [address]             [phone]      Deeanne [facility] Medical Record #[id number] Date of Birth: [birth year]  Referring: Lorre Nick, MD Primary Care: Bernita Raisin., MD Primary Cardiologist: No primary care provider on file.   Chief Complaint:   POST OP FOLLOW UP OPERATIVE REPORT DATE OF PROCEDURE:  04/14/2020 PREOPERATIVE DIAGNOSIS:  Recurrent right spontaneous pneumothorax with persistent air leak. POSTOPERATIVE DIAGNOSIS:  Recurrent right spontaneous pneumothorax with persistent air leak. SURGICAL PROCEDURE PERFORMED:  Bronchoscopy, right robotic-assisted thoracoscopy with pleural biopsy and stapling of the lower lobe bleb and intercostal nerve block. History of Present Illness:      Patient returns to the office today for postop visit and chest x-ray and suture removal.  She notes she has been breathing well some minor cough nonproductive, denies any fever or chills.      Past Medical History:  Diagnosis Date  . Angio-edema   . Childhood asthma   . Chondromalacia of both patellae   . Chronic bronchitis (HCC)    "usually q yr; haven't had it in the last 2 yrs" (01/16/2017)  . Heart murmur   . High cholesterol   . Interstitial cystitis   . Migraine    "stopped in 2015" (01/16/2017)  . Urticaria      Social History   Tobacco Use  Smoking Status Never Smoker  Smokeless Tobacco Never Used    Social History   Substance and Sexual Activity  Alcohol Use No     Allergies  Allergen Reactions  . Codeine Nausea And Vomiting    Current Outpatient Medications  Medication Sig Dispense Refill  . diclofenac (CATAFLAM) 50 MG tablet Take 50 mg by mouth daily.     Marland Kitchen estradiol (ESTRACE) 1 MG tablet Take 1 tablet by mouth daily.    Marland Kitchen ezetimibe (ZETIA) 10 MG tablet Take 10 mg by mouth every evening.    . fexofenadine (ALLEGRA) 180 MG tablet Take 1 tablet (180 mg total) by mouth daily. 30 tablet 5  .  LORazepam (ATIVAN) 0.5 MG tablet Take 0.5 mg by mouth daily as needed for anxiety.    Marland Kitchen omega-3 acid ethyl esters (LOVAZA) 1 g capsule Take 1 g by mouth 2 (two) times daily.    Marland Kitchen omeprazole (PRILOSEC) 20 MG capsule Take 20 mg by mouth in the morning.    . sertraline (ZOLOFT) 25 MG tablet Take 25 mg by mouth daily.     No current facility-administered medications for this visit.       Physical Exam: BP 127/70   Pulse 65   Temp 97.6 F (36.4 C) (Skin)   Resp 20   Ht 5\' 6"  (1.676 m)   Wt 124 lb (56.2 kg)   SpO2 99% Comment: RA  BMI 20.01 kg/m   General appearance: alert and cooperative Neurologic: intact Heart: regular rate and rhythm, S1, S2 normal, no murmur, click, rub or gallop Lungs: clear to auscultation bilaterally Abdomen: soft, non-tender; bowel sounds normal; no masses,  no organomegaly Extremities: extremities normal, atraumatic, no cyanosis or edema and Homans sign is negative, no sign of DVT Wound: Port sites are all well-healed, chest tube sites healed without infection chest tube sutures were removed   Diagnostic Studies & Laboratory data:     Recent Radiology Findings:   DG Chest 2 View  Result Date: 04/29/2020 CLINICAL DATA:  Spontaneous pneumothorax. EXAM: CHEST - 2 VIEW COMPARISON:  04/17/2020. FINDINGS: Surgical sutures right upper lung. Interim progression of right hydropneumothorax. Persistent density noted over the right lung base. Left lung is clear. Heart size normal. Bony structures are stable. IMPRESSION: Interim progression of right hydropneumothorax. Persistent density noted over the right lung base. These results will be called to the ordering clinician or representative by the Radiologist Assistant, and communication documented in the PACS or [employer]. Electronically Signed   By: Maisie Fus  Register   On: 04/29/2020 09:31    I have independently reviewed the above radiology studies  and reviewed the findings with the patient.    Recent Lab  Findings: Lab Results  Component Value Date   WBC 9.5 04/14/2020   HGB 12.3 04/14/2020   HCT 37.2 04/14/2020   PLT 207 04/14/2020   GLUCOSE 130 (H) 04/15/2020   ALT 32 04/14/2020   AST 24 04/14/2020   NA 136 04/15/2020   K 4.4 04/15/2020   CL 104 04/15/2020   CREATININE 0.78 04/15/2020   BUN 11 04/15/2020   CO2 26 04/15/2020   INR 1.0 04/14/2020      Assessment / Plan:   #1 postop visit-doing well surgically and symptomatically, has a small basilar pneumothorax-we will plan to follow this with chest x-ray in 1 week follow-up visit in the office   Medication Changes: No orders of the defined types were placed in this encounter.     Delight Ovens MD      [address] [place] [place] [address] Office [phone]     04/29/2020 9:47 AM

## 2020-04-29 ENCOUNTER — Telehealth: Payer: Self-pay

## 2020-04-29 ENCOUNTER — Other Ambulatory Visit: Payer: Self-pay

## 2020-04-29 ENCOUNTER — Ambulatory Visit
Admission: RE | Admit: 2020-04-29 | Discharge: 2020-04-29 | Disposition: A | Discharge status: Home / Self Care | Payer: BC Managed Care – PPO | Source: Ambulatory Visit | Attending: Cardiothoracic Surgery | Admitting: Cardiothoracic Surgery

## 2020-04-29 ENCOUNTER — Ambulatory Visit (INDEPENDENT_AMBULATORY_CARE_PROVIDER_SITE_OTHER): Payer: Self-pay | Admitting: Cardiothoracic Surgery

## 2020-04-29 VITALS — BP 127/70 | HR 65 | Temp 97.6°F | Resp 20 | Ht 66.0 in | Wt 124.0 lb

## 2020-04-29 DIAGNOSIS — J939 Pneumothorax, unspecified: Secondary | ICD-10-CM

## 2020-04-29 DIAGNOSIS — J984 Other disorders of lung: Secondary | ICD-10-CM | POA: Diagnosis not present

## 2020-04-29 DIAGNOSIS — J948 Other specified pleural conditions: Secondary | ICD-10-CM | POA: Diagnosis not present

## 2020-04-29 NOTE — Telephone Encounter (Signed)
Jane with [facility] contacted the office with call results from patient's chest xray, Dr. Tyrone Sage in room with patient and will go over results during visit.

## 2020-05-04 ENCOUNTER — Other Ambulatory Visit: Payer: Self-pay | Admitting: Cardiothoracic Surgery

## 2020-05-04 DIAGNOSIS — Z938 Other artificial opening status: Secondary | ICD-10-CM

## 2020-05-04 DIAGNOSIS — J939 Pneumothorax, unspecified: Secondary | ICD-10-CM

## 2020-05-06 ENCOUNTER — Encounter: Payer: Self-pay | Admitting: Cardiothoracic Surgery

## 2020-05-06 ENCOUNTER — Ambulatory Visit
Admission: RE | Admit: 2020-05-06 | Discharge: 2020-05-06 | Disposition: A | Discharge status: Home / Self Care | Payer: BC Managed Care – PPO | Source: Ambulatory Visit | Attending: Cardiothoracic Surgery | Admitting: Cardiothoracic Surgery

## 2020-05-06 ENCOUNTER — Other Ambulatory Visit: Payer: Self-pay

## 2020-05-06 ENCOUNTER — Ambulatory Visit (INDEPENDENT_AMBULATORY_CARE_PROVIDER_SITE_OTHER): Payer: Self-pay | Admitting: Cardiothoracic Surgery

## 2020-05-06 VITALS — BP 133/86 | HR 55 | Resp 20 | Ht 66.0 in

## 2020-05-06 DIAGNOSIS — J939 Pneumothorax, unspecified: Secondary | ICD-10-CM

## 2020-05-06 DIAGNOSIS — J948 Other specified pleural conditions: Secondary | ICD-10-CM | POA: Diagnosis not present

## 2020-05-06 DIAGNOSIS — Z938 Other artificial opening status: Secondary | ICD-10-CM

## 2020-05-06 NOTE — Progress Notes (Signed)
[address]       [place] [address]             [phone]      Christine Guerrero [facility] Medical Record #[id number] Date of Birth: 06-Nov-1970  Referring: Lorre Nick, MD Primary Care: Bernita Raisin., MD Primary Cardiologist: No primary care provider on file.   Chief Complaint:   POST OP FOLLOW UP OPERATIVE REPORT DATE OF PROCEDURE:  04/14/2020 PREOPERATIVE DIAGNOSIS:  Recurrent right spontaneous pneumothorax with persistent air leak. POSTOPERATIVE DIAGNOSIS:  Recurrent right spontaneous pneumothorax with persistent air leak. SURGICAL PROCEDURE PERFORMED:  Bronchoscopy, right robotic-assisted thoracoscopy with pleural biopsy and stapling of the lower lobe bleb and intercostal nerve block. History of Present Illness:      Patient returns to the office today for postop visit and chest x-ray .  She notes that over the past week her overall physical strength is increased she has some vague sensation of coughing difficulties clearing secretions, describes this as gurgling   Past Medical History:  Diagnosis Date  . Angio-edema   . Childhood asthma   . Chondromalacia of both patellae   . Chronic bronchitis (HCC)    "usually q yr; haven't had it in the last 2 yrs" (01/16/2017)  . Heart murmur   . High cholesterol   . Interstitial cystitis   . Migraine    "stopped in 2015" (01/16/2017)  . Urticaria      Social History   Tobacco Use  Smoking Status Never Smoker  Smokeless Tobacco Never Used    Social History   Substance and Sexual Activity  Alcohol Use No     Allergies  Allergen Reactions  . Codeine Nausea And Vomiting    Current Outpatient Medications  Medication Sig Dispense Refill  . diclofenac (CATAFLAM) 50 MG tablet Take 50 mg by mouth daily.     Marland Kitchen estradiol (ESTRACE) 1 MG tablet Take 1 tablet by mouth daily.    Marland Kitchen ezetimibe (ZETIA) 10 MG tablet Take 10 mg by mouth every evening.    . fexofenadine (ALLEGRA) 180 MG tablet  Take 1 tablet (180 mg total) by mouth daily. 30 tablet 5  . LORazepam (ATIVAN) 0.5 MG tablet Take 0.5 mg by mouth daily as needed for anxiety.    Marland Kitchen omega-3 acid ethyl esters (LOVAZA) 1 g capsule Take 1 g by mouth 2 (two) times daily.    Marland Kitchen omeprazole (PRILOSEC) 20 MG capsule Take 20 mg by mouth in the morning.    . sertraline (ZOLOFT) 25 MG tablet Take 25 mg by mouth daily.     No current facility-administered medications for this visit.       Physical Exam: BP 133/86 (BP Location: Left Arm, Patient Position: Sitting)   Pulse (!) 55   Resp 20   Ht 5\' 6"  (1.676 m)   SpO2 99% Comment: RA with mask on  BMI 20.01 kg/m   General appearance: alert and cooperative Neurologic: intact Heart: regular rate and rhythm, S1, S2 normal, no murmur, click, rub or gallop Lungs: clear to auscultation bilaterally Abdomen: soft, non-tender; bowel sounds normal; no masses,  no organomegaly Extremities: extremities normal, atraumatic, no cyanosis or edema and Homans sign is negative, no sign of DVT Wound: Port sites are all well-healed, chest tube sites healed without infection chest tube sutures were removed   Diagnostic Studies & Laboratory data:     Recent Radiology Findings:   DG Chest 2 View  Result Date: 05/06/2020  CLINICAL DATA:  Follow-up pneumothorax EXAM: CHEST - 2 VIEW COMPARISON:  04/29/2020, 04/17/2020, CT 04/05/2020 FINDINGS: Surgical suture at the right apex. Residual hydropneumothorax at the right base, decreased compared with 04/29/2020. Probable trace apicolateral pneumothorax. Peripheral right lower lung opacity also less conspicuous. The left lung is clear. The cardiomediastinal silhouette is stable. IMPRESSION: Interval decrease in size of right-sided hydropneumothorax, with small residual at the right base and probable tiny pneumothorax component apical laterally. Decreased peripheral right lower lung opacity. Electronically Signed   By: Jasmine Pang M.D.   On: 05/06/2020 16:47     I have independently reviewed the above radiology studies  and reviewed the findings with the patient.    Recent Lab Findings: Lab Results  Component Value Date   WBC 9.5 04/14/2020   HGB 12.3 04/14/2020   HCT 37.2 04/14/2020   PLT 207 04/14/2020   GLUCOSE 130 (H) 04/15/2020   ALT 32 04/14/2020   AST 24 04/14/2020   NA 136 04/15/2020   K 4.4 04/15/2020   CL 104 04/15/2020   CREATININE 0.78 04/15/2020   BUN 11 04/15/2020   CO2 26 04/15/2020   INR 1.0 04/14/2020      Assessment / Plan:   #1 postop visit-doing well surgically and symptomatically,  small basilar pneumothorax-he has now almost completely resolved . we will plan to follow this with chest x-ray in 2 weeks follow-up visit in the office   Medication Changes: No orders of the defined types were placed in this encounter.     Delight Ovens MD      [address] [place] [place] [address] Office [phone]     05/06/2020 5:46 PM

## 2020-05-14 ENCOUNTER — Other Ambulatory Visit: Payer: Self-pay | Admitting: Cardiothoracic Surgery

## 2020-05-14 DIAGNOSIS — J9383 Other pneumothorax: Secondary | ICD-10-CM

## 2020-05-20 ENCOUNTER — Other Ambulatory Visit: Payer: Self-pay

## 2020-05-20 ENCOUNTER — Ambulatory Visit
Admission: RE | Admit: 2020-05-20 | Discharge: 2020-05-20 | Disposition: A | Discharge status: Home / Self Care | Payer: BC Managed Care – PPO | Source: Ambulatory Visit | Attending: Cardiothoracic Surgery | Admitting: Cardiothoracic Surgery

## 2020-05-20 ENCOUNTER — Encounter: Payer: Self-pay | Admitting: Cardiothoracic Surgery

## 2020-05-20 ENCOUNTER — Ambulatory Visit (INDEPENDENT_AMBULATORY_CARE_PROVIDER_SITE_OTHER): Payer: Self-pay | Admitting: Cardiothoracic Surgery

## 2020-05-20 VITALS — BP 121/79 | HR 67 | Resp 20 | Ht 66.0 in | Wt 125.0 lb

## 2020-05-20 DIAGNOSIS — J984 Other disorders of lung: Secondary | ICD-10-CM | POA: Diagnosis not present

## 2020-05-20 DIAGNOSIS — J939 Pneumothorax, unspecified: Secondary | ICD-10-CM | POA: Diagnosis not present

## 2020-05-20 DIAGNOSIS — R918 Other nonspecific abnormal finding of lung field: Secondary | ICD-10-CM | POA: Diagnosis not present

## 2020-05-20 DIAGNOSIS — J948 Other specified pleural conditions: Secondary | ICD-10-CM | POA: Diagnosis not present

## 2020-05-20 DIAGNOSIS — J9383 Other pneumothorax: Secondary | ICD-10-CM

## 2020-05-20 DIAGNOSIS — Z938 Other artificial opening status: Secondary | ICD-10-CM

## 2020-05-20 NOTE — Progress Notes (Signed)
[address]       [place] [address]             [phone]      Jasey [facility] Medical Record #[id number] Date of Birth: 1971/02/17  Referring: Hillary Bow, DO Primary Care: Bernita Raisin., MD Primary Cardiologist: No primary care provider on file.   Chief Complaint:   POST OP FOLLOW UP OPERATIVE REPORT DATE OF PROCEDURE:  04/14/2020 PREOPERATIVE DIAGNOSIS:  Recurrent right spontaneous pneumothorax with persistent air leak. POSTOPERATIVE DIAGNOSIS:  Recurrent right spontaneous pneumothorax with persistent air leak. SURGICAL PROCEDURE PERFORMED:  Bronchoscopy, right robotic-assisted thoracoscopy with pleural biopsy and stapling of the lower lobe bleb and intercostal nerve block. History of Present Illness:      Patient returns to the office today for postop visit and chest x-ray .  Since last seen she has had no respiratory difficulty.    Past Medical History:  Diagnosis Date  . Angio-edema   . Childhood asthma   . Chondromalacia of both patellae   . Chronic bronchitis (HCC)    "usually q yr; haven't had it in the last 2 yrs" (01/16/2017)  . Heart murmur   . High cholesterol   . Interstitial cystitis   . Migraine    "stopped in 2015" (01/16/2017)  . Urticaria      Social History   Tobacco Use  Smoking Status Never Smoker  Smokeless Tobacco Never Used    Social History   Substance and Sexual Activity  Alcohol Use No     Allergies  Allergen Reactions  . Codeine Nausea And Vomiting    Current Outpatient Medications  Medication Sig Dispense Refill  . diclofenac (CATAFLAM) 50 MG tablet Take 50 mg by mouth daily.     Marland Kitchen estradiol (ESTRACE) 1 MG tablet Take 1 tablet by mouth daily.    Marland Kitchen ezetimibe (ZETIA) 10 MG tablet Take 10 mg by mouth every evening.    . fexofenadine (ALLEGRA) 180 MG tablet Take 1 tablet (180 mg total) by mouth daily. 30 tablet 5  . LORazepam (ATIVAN) 0.5 MG tablet Take 0.5 mg by mouth daily as  needed for anxiety.    Marland Kitchen omega-3 acid ethyl esters (LOVAZA) 1 g capsule Take 1 g by mouth 2 (two) times daily.    Marland Kitchen omeprazole (PRILOSEC) 20 MG capsule Take 20 mg by mouth in the morning.    . sertraline (ZOLOFT) 25 MG tablet Take 25 mg by mouth daily.     No current facility-administered medications for this visit.       Physical Exam: BP 121/79   Pulse 67   Resp 20   Ht 5\' 6"  (1.676 m)   Wt 125 lb (56.7 kg)   SpO2 97% Comment: RA  BMI 20.18 kg/m  General appearance: alert and cooperative Resp: rubs RLL Cardio: regular rate and rhythm, S1, S2 normal, no murmur, click, rub or gallop     Diagnostic Studies & Laboratory data:     Recent Radiology Findings:   DG Chest 2 View  Result Date: 05/20/2020 CLINICAL DATA:  Follow-up pneumothorax EXAM: CHEST - 2 VIEW COMPARISON:  05/06/2020 FINDINGS: Cardiac shadow is within normal limits. Postsurgical changes are noted in the right lung. A right basilar hydropneumothorax is noted and may be slightly larger than that seen on the prior exam. Nodular density is again seen in the right lung base stable from the prior exam. Left lung remains clear. Bilateral breast implants  are noted. No bony abnormality is seen. IMPRESSION: Stable nodular density in the right lung base. Postsurgical changes on the right with a slightly larger right basilar hydropneumothorax. Electronically Signed   By: Alcide Clever M.D.   On: 05/20/2020 13:07    I have independently reviewed the above radiology studies  and reviewed the findings with the patient.    Recent Lab Findings: Lab Results  Component Value Date   WBC 9.5 04/14/2020   HGB 12.3 04/14/2020   HCT 37.2 04/14/2020   PLT 207 04/14/2020   GLUCOSE 130 (H) 04/15/2020   ALT 32 04/14/2020   AST 24 04/14/2020   NA 136 04/15/2020   K 4.4 04/15/2020   CL 104 04/15/2020   CREATININE 0.78 04/15/2020   BUN 11 04/15/2020   CO2 26 04/15/2020   INR 1.0 04/14/2020      Assessment / Plan:   #1 postop  visit-doing well surgically and symptomatically,  small basilar pneumothorax, slight rub on exam-   Plan follow-up chest x-ray in 2 weeks    Medication Changes: No orders of the defined types were placed in this encounter.     Delight Ovens MD      [address] [place] [place] [address] Office [phone]     05/20/2020 1:35 PM

## 2020-06-02 ENCOUNTER — Other Ambulatory Visit: Payer: Self-pay | Admitting: Cardiothoracic Surgery

## 2020-06-02 DIAGNOSIS — J939 Pneumothorax, unspecified: Secondary | ICD-10-CM

## 2020-06-03 ENCOUNTER — Encounter: Payer: Self-pay | Admitting: Cardiothoracic Surgery

## 2020-06-03 ENCOUNTER — Ambulatory Visit (INDEPENDENT_AMBULATORY_CARE_PROVIDER_SITE_OTHER): Payer: Self-pay | Admitting: Cardiothoracic Surgery

## 2020-06-03 ENCOUNTER — Other Ambulatory Visit: Payer: Self-pay

## 2020-06-03 ENCOUNTER — Ambulatory Visit
Admission: RE | Admit: 2020-06-03 | Discharge: 2020-06-03 | Disposition: A | Discharge status: Home / Self Care | Payer: BC Managed Care – PPO | Source: Ambulatory Visit | Attending: Cardiothoracic Surgery | Admitting: Cardiothoracic Surgery

## 2020-06-03 VITALS — BP 121/78 | HR 60 | Temp 98.1°F | Resp 18 | Ht 66.0 in | Wt 128.4 lb

## 2020-06-03 DIAGNOSIS — J939 Pneumothorax, unspecified: Secondary | ICD-10-CM

## 2020-06-03 DIAGNOSIS — J9383 Other pneumothorax: Secondary | ICD-10-CM

## 2020-06-03 NOTE — Progress Notes (Signed)
[address]       [place] [address]             [phone]      Trixy [facility] Medical Record #[id number] Date of Birth: [birth year]  Referring: Bernita Raisin., MD Primary Care: Bernita Raisin., MD Primary Cardiologist: No primary care provider on file.   Chief Complaint:   POST OP FOLLOW UP OPERATIVE REPORT DATE OF PROCEDURE:  04/14/2020 PREOPERATIVE DIAGNOSIS:  Recurrent right spontaneous pneumothorax with persistent air leak. POSTOPERATIVE DIAGNOSIS:  Recurrent right spontaneous pneumothorax with persistent air leak. SURGICAL PROCEDURE PERFORMED:  Bronchoscopy, right robotic-assisted thoracoscopy with pleural biopsy and stapling of the lower lobe bleb and intercostal nerve block. History of Present Illness:      Patient returns to the office for follow-up chest x-ray after recurrent right spontaneous pneumothorax with persistent air leak.  Since last seen the patient has had no respiratory symptoms.  She denies shortness of breath or chest discomfort.  Has avoided any COVID symptoms over the holidays.     Past Medical History:  Diagnosis Date  . Angio-edema   . Childhood asthma   . Chondromalacia of both patellae   . Chronic bronchitis (HCC)    "usually q yr; haven't had it in the last 2 yrs" (01/16/2017)  . Heart murmur   . High cholesterol   . Interstitial cystitis   . Migraine    "stopped in 2015" (01/16/2017)  . Urticaria      Social History   Tobacco Use  Smoking Status Never Smoker  Smokeless Tobacco Never Used    Social History   Substance and Sexual Activity  Alcohol Use No     Allergies  Allergen Reactions  . Codeine Nausea And Vomiting    Current Outpatient Medications  Medication Sig Dispense Refill  . diclofenac (CATAFLAM) 50 MG tablet Take 50 mg by mouth daily.     Marland Kitchen estradiol (ESTRACE) 1 MG tablet Take 1 tablet by mouth daily.    Marland Kitchen ezetimibe (ZETIA) 10 MG tablet Take 10 mg by mouth every  evening.    . fexofenadine (ALLEGRA) 180 MG tablet Take 1 tablet (180 mg total) by mouth daily. 30 tablet 5  . LORazepam (ATIVAN) 0.5 MG tablet Take 0.5 mg by mouth daily as needed for anxiety.    Marland Kitchen omega-3 acid ethyl esters (LOVAZA) 1 g capsule Take 1 g by mouth 2 (two) times daily.    Marland Kitchen omeprazole (PRILOSEC) 20 MG capsule Take 20 mg by mouth in the morning.    . sertraline (ZOLOFT) 25 MG tablet Take 25 mg by mouth daily.     No current facility-administered medications for this visit.       Physical Exam: BP 121/78 (BP Location: Left Arm, Patient Position: Sitting, Cuff Size: Normal)   Pulse 60   Temp 98.1 F (36.7 C) (Skin)   Resp 18   Ht 5\' 6"  (1.676 m)   Wt 128 lb 6.4 oz (58.2 kg)   BMI 20.72 kg/m  General appearance: alert and cooperative Resp: clear to auscultation bilaterally Cardio: regular rate and rhythm, S1, S2 normal, no murmur, click, rub or gallop   Diagnostic Studies & Laboratory data:     Recent Radiology Findings:   DG Chest 2 View  Result Date: 06/03/2020 CLINICAL DATA:  Follow-up pneumothorax. EXAM: CHEST - 2 VIEW COMPARISON:  Multiple prior chest films. The most recent is 05/20/2020 FINDINGS: The cardiac silhouette, mediastinal and hilar  contours are within normal limits and stable. Stable surgical changes involving the right upper lobe. There is a persistent small loculated right basilar pneumothorax. No pleural effusion. The left lung is clear. IMPRESSION: Persistent small loculated right basilar pneumothorax. Electronically Signed   By: Rudie Meyer M.D.   On: 06/03/2020 11:06    Appears improved from the chest x-ray in late December  I have independently reviewed the above radiology studies  and reviewed the findings with the patient.    Recent Lab Findings: Lab Results  Component Value Date   WBC 9.5 04/14/2020   HGB 12.3 04/14/2020   HCT 37.2 04/14/2020   PLT 207 04/14/2020   GLUCOSE 130 (H) 04/15/2020   ALT 32 04/14/2020   AST 24  04/14/2020   NA 136 04/15/2020   K 4.4 04/15/2020   CL 104 04/15/2020   CREATININE 0.78 04/15/2020   BUN 11 04/15/2020   CO2 26 04/15/2020   INR 1.0 04/14/2020      Assessment / Plan:   Patient making good progress after surgery for recurrent spontaneous pneumothorax in November.  Chest x-ray has a small residual shrinking-   on the right    Plan follow up chest xray 6 weeks   Medication Changes: No orders of the defined types were placed in this encounter.     Delight Ovens MD      [address] [place] [place] [address] Office [phone]     06/03/2020 11:23 AM

## 2020-07-14 ENCOUNTER — Other Ambulatory Visit: Payer: Self-pay | Admitting: Cardiothoracic Surgery

## 2020-07-14 DIAGNOSIS — J9383 Other pneumothorax: Secondary | ICD-10-CM

## 2020-07-15 ENCOUNTER — Other Ambulatory Visit: Payer: Self-pay

## 2020-07-15 ENCOUNTER — Ambulatory Visit
Admission: RE | Admit: 2020-07-15 | Discharge: 2020-07-15 | Disposition: A | Discharge status: Home / Self Care | Payer: BC Managed Care – PPO | Source: Ambulatory Visit | Attending: Cardiothoracic Surgery | Admitting: Cardiothoracic Surgery

## 2020-07-15 ENCOUNTER — Ambulatory Visit (INDEPENDENT_AMBULATORY_CARE_PROVIDER_SITE_OTHER): Payer: Self-pay | Admitting: Cardiothoracic Surgery

## 2020-07-15 VITALS — BP 140/79 | HR 55 | Resp 20 | Ht 66.0 in | Wt 127.0 lb

## 2020-07-15 DIAGNOSIS — J939 Pneumothorax, unspecified: Secondary | ICD-10-CM

## 2020-07-15 DIAGNOSIS — J90 Pleural effusion, not elsewhere classified: Secondary | ICD-10-CM | POA: Diagnosis not present

## 2020-07-15 DIAGNOSIS — Z938 Other artificial opening status: Secondary | ICD-10-CM

## 2020-07-15 DIAGNOSIS — J9811 Atelectasis: Secondary | ICD-10-CM | POA: Diagnosis not present

## 2020-07-15 DIAGNOSIS — J9383 Other pneumothorax: Secondary | ICD-10-CM

## 2020-07-15 DIAGNOSIS — M419 Scoliosis, unspecified: Secondary | ICD-10-CM | POA: Diagnosis not present

## 2020-07-15 NOTE — Progress Notes (Signed)
[address]       [place] [address]             [phone]      Christine Guerrero [facility] Medical Record #[id number] Date of Birth: March 12, 1971  Referring: Lorre Nick, MD Primary Care: Bernita Raisin., MD Primary Cardiologist: No primary care provider on file.   Chief Complaint:   POST OP FOLLOW UP OPERATIVE REPORT DATE OF PROCEDURE:  04/14/2020 PREOPERATIVE DIAGNOSIS:  Recurrent right spontaneous pneumothorax with persistent air leak. POSTOPERATIVE DIAGNOSIS:  Recurrent right spontaneous pneumothorax with persistent air leak. SURGICAL PROCEDURE PERFORMED:  Bronchoscopy, right robotic-assisted thoracoscopy with pleural biopsy and stapling of the lower lobe bleb and intercostal nerve block. History of Present Illness:      Patient returns to the office for follow-up chest x-ray after recurrent right spontaneous pneumothorax with persistent air leak.  She is returned to normal activities without shortness of breath or respiratory symptoms.  She notes some mild paresthesias across the lower right chest especially when she first gets up from sleeping.  This does not interfere with her activity.      Past Medical History:  Diagnosis Date  . Angio-edema   . Childhood asthma   . Chondromalacia of both patellae   . Chronic bronchitis (HCC)    "usually q yr; haven't had it in the last 2 yrs" (01/16/2017)  . Heart murmur   . High cholesterol   . Interstitial cystitis   . Migraine    "stopped in 2015" (01/16/2017)  . Urticaria      Social History   Tobacco Use  Smoking Status Never Smoker  Smokeless Tobacco Never Used    Social History   Substance and Sexual Activity  Alcohol Use No     Allergies  Allergen Reactions  . Codeine Nausea And Vomiting    Current Outpatient Medications  Medication Sig Dispense Refill  . diclofenac (CATAFLAM) 50 MG tablet Take 50 mg by mouth daily.     Marland Kitchen estradiol (ESTRACE) 1 MG tablet Take 1 tablet by  mouth daily.    Marland Kitchen ezetimibe (ZETIA) 10 MG tablet Take 10 mg by mouth every evening.    . fexofenadine (ALLEGRA) 180 MG tablet Take 1 tablet (180 mg total) by mouth daily. 30 tablet 5  . LORazepam (ATIVAN) 0.5 MG tablet Take 0.5 mg by mouth daily as needed for anxiety.    Marland Kitchen omega-3 acid ethyl esters (LOVAZA) 1 g capsule Take 1 g by mouth 2 (two) times daily.    . sertraline (ZOLOFT) 25 MG tablet Take 25 mg by mouth daily.    Marland Kitchen omeprazole (PRILOSEC) 20 MG capsule Take 20 mg by mouth in the morning.     No current facility-administered medications for this visit.       Physical Exam: BP 140/79   Pulse (!) 55   Resp 20   Ht 5\' 6"  (1.676 m)   Wt 127 lb (57.6 kg)   SpO2 100% Comment: RA  BMI 20.50 kg/m  General appearance: alert, cooperative and no distress Neck: no adenopathy, no carotid bruit, no JVD, supple, symmetrical, trachea midline and thyroid not enlarged, symmetric, no tenderness/mass/nodules Resp: clear to auscultation bilaterally Cardio: regular rate and rhythm, S1, S2 normal, no murmur, click, rub or gallop  Diagnostic Studies & Laboratory data:     Recent Radiology Findings:   DG Chest 2 View  Result Date: 07/15/2020 CLINICAL DATA:  Spontaneous pneumothorax. EXAM: CHEST - 2 VIEW  COMPARISON:  06/03/2020. FINDINGS: Mediastinum hilar structures normal. Surgical sutures right lung. Persistent right base pneumothorax without interim change. Persistent atelectatic changes right lung base. Tiny right pleural effusion. Left lung is clear. Heart size normal. Mild thoracic spine scoliosis. IMPRESSION: 1. Persistent right base pneumothorax without interim change. 2. Postsurgical changes right upper lung. Persistent atelectatic changes right lung base. Tiny right pleural effusion. Electronically Signed   By: Maisie Fus  Register   On: 07/15/2020 09:41     I have independently reviewed the above radiology studies  and reviewed the findings with the patient.    Recent Lab Findings: Lab  Results  Component Value Date   WBC 9.5 04/14/2020   HGB 12.3 04/14/2020   HCT 37.2 04/14/2020   PLT 207 04/14/2020   GLUCOSE 130 (H) 04/15/2020   ALT 32 04/14/2020   AST 24 04/14/2020   NA 136 04/15/2020   K 4.4 04/15/2020   CL 104 04/15/2020   CREATININE 0.78 04/15/2020   BUN 11 04/15/2020   CO2 26 04/15/2020   INR 1.0 04/14/2020      Assessment / Plan:   Stable following surgery for spontaneous pneumothorax on the right with persistent air leak-small residual pneumothorax which is stable on chest x-ray.   Follow-up as needed-I reviewed with her symptoms that would precipitate a need for chest x-ray.    Medication Changes: No orders of the defined types were placed in this encounter.     Delight Ovens MD      [address] [place].[address] [place] [address] Office [phone]     07/15/2020 12:26 PM

## 2020-07-21 DIAGNOSIS — Z1231 Encounter for screening mammogram for malignant neoplasm of breast: Secondary | ICD-10-CM | POA: Diagnosis not present

## 2020-09-06 DIAGNOSIS — M25512 Pain in left shoulder: Secondary | ICD-10-CM | POA: Diagnosis not present

## 2020-10-07 DIAGNOSIS — M25512 Pain in left shoulder: Secondary | ICD-10-CM | POA: Diagnosis not present

## 2020-10-15 DIAGNOSIS — M7502 Adhesive capsulitis of left shoulder: Secondary | ICD-10-CM | POA: Diagnosis not present

## 2020-10-15 DIAGNOSIS — M25512 Pain in left shoulder: Secondary | ICD-10-CM | POA: Diagnosis not present

## 2020-10-20 DIAGNOSIS — M542 Cervicalgia: Secondary | ICD-10-CM | POA: Diagnosis not present

## 2020-10-20 DIAGNOSIS — M25512 Pain in left shoulder: Secondary | ICD-10-CM | POA: Diagnosis not present

## 2020-10-20 DIAGNOSIS — M79602 Pain in left arm: Secondary | ICD-10-CM | POA: Diagnosis not present

## 2020-10-20 DIAGNOSIS — M6281 Muscle weakness (generalized): Secondary | ICD-10-CM | POA: Diagnosis not present

## 2020-10-20 DIAGNOSIS — M25612 Stiffness of left shoulder, not elsewhere classified: Secondary | ICD-10-CM | POA: Diagnosis not present

## 2020-10-20 DIAGNOSIS — M7502 Adhesive capsulitis of left shoulder: Secondary | ICD-10-CM | POA: Diagnosis not present

## 2020-10-20 DIAGNOSIS — M2560 Stiffness of unspecified joint, not elsewhere classified: Secondary | ICD-10-CM | POA: Diagnosis not present

## 2020-10-20 DIAGNOSIS — R293 Abnormal posture: Secondary | ICD-10-CM | POA: Diagnosis not present

## 2020-10-26 DIAGNOSIS — M6281 Muscle weakness (generalized): Secondary | ICD-10-CM | POA: Diagnosis not present

## 2020-10-26 DIAGNOSIS — M7502 Adhesive capsulitis of left shoulder: Secondary | ICD-10-CM | POA: Diagnosis not present

## 2020-10-26 DIAGNOSIS — M79602 Pain in left arm: Secondary | ICD-10-CM | POA: Diagnosis not present

## 2020-10-26 DIAGNOSIS — M25612 Stiffness of left shoulder, not elsewhere classified: Secondary | ICD-10-CM | POA: Diagnosis not present

## 2020-10-26 DIAGNOSIS — M25512 Pain in left shoulder: Secondary | ICD-10-CM | POA: Diagnosis not present

## 2020-10-26 DIAGNOSIS — M542 Cervicalgia: Secondary | ICD-10-CM | POA: Diagnosis not present

## 2020-10-26 DIAGNOSIS — R293 Abnormal posture: Secondary | ICD-10-CM | POA: Diagnosis not present

## 2020-10-26 DIAGNOSIS — M2560 Stiffness of unspecified joint, not elsewhere classified: Secondary | ICD-10-CM | POA: Diagnosis not present

## 2020-10-29 DIAGNOSIS — M2560 Stiffness of unspecified joint, not elsewhere classified: Secondary | ICD-10-CM | POA: Diagnosis not present

## 2020-10-29 DIAGNOSIS — M25612 Stiffness of left shoulder, not elsewhere classified: Secondary | ICD-10-CM | POA: Diagnosis not present

## 2020-10-29 DIAGNOSIS — M7502 Adhesive capsulitis of left shoulder: Secondary | ICD-10-CM | POA: Diagnosis not present

## 2020-10-29 DIAGNOSIS — M542 Cervicalgia: Secondary | ICD-10-CM | POA: Diagnosis not present

## 2020-10-29 DIAGNOSIS — M6281 Muscle weakness (generalized): Secondary | ICD-10-CM | POA: Diagnosis not present

## 2020-10-29 DIAGNOSIS — M25512 Pain in left shoulder: Secondary | ICD-10-CM | POA: Diagnosis not present

## 2020-10-29 DIAGNOSIS — R293 Abnormal posture: Secondary | ICD-10-CM | POA: Diagnosis not present

## 2020-10-29 DIAGNOSIS — M79602 Pain in left arm: Secondary | ICD-10-CM | POA: Diagnosis not present

## 2020-11-02 DIAGNOSIS — M542 Cervicalgia: Secondary | ICD-10-CM | POA: Diagnosis not present

## 2020-11-02 DIAGNOSIS — M7502 Adhesive capsulitis of left shoulder: Secondary | ICD-10-CM | POA: Diagnosis not present

## 2020-11-02 DIAGNOSIS — M2560 Stiffness of unspecified joint, not elsewhere classified: Secondary | ICD-10-CM | POA: Diagnosis not present

## 2020-11-02 DIAGNOSIS — M79602 Pain in left arm: Secondary | ICD-10-CM | POA: Diagnosis not present

## 2020-11-02 DIAGNOSIS — M6281 Muscle weakness (generalized): Secondary | ICD-10-CM | POA: Diagnosis not present

## 2020-11-02 DIAGNOSIS — R293 Abnormal posture: Secondary | ICD-10-CM | POA: Diagnosis not present

## 2020-11-02 DIAGNOSIS — M25612 Stiffness of left shoulder, not elsewhere classified: Secondary | ICD-10-CM | POA: Diagnosis not present

## 2020-11-02 DIAGNOSIS — M25512 Pain in left shoulder: Secondary | ICD-10-CM | POA: Diagnosis not present

## 2020-11-04 DIAGNOSIS — M6281 Muscle weakness (generalized): Secondary | ICD-10-CM | POA: Diagnosis not present

## 2020-11-04 DIAGNOSIS — M79602 Pain in left arm: Secondary | ICD-10-CM | POA: Diagnosis not present

## 2020-11-04 DIAGNOSIS — M542 Cervicalgia: Secondary | ICD-10-CM | POA: Diagnosis not present

## 2020-11-04 DIAGNOSIS — M2560 Stiffness of unspecified joint, not elsewhere classified: Secondary | ICD-10-CM | POA: Diagnosis not present

## 2020-11-04 DIAGNOSIS — M7502 Adhesive capsulitis of left shoulder: Secondary | ICD-10-CM | POA: Diagnosis not present

## 2020-11-04 DIAGNOSIS — R293 Abnormal posture: Secondary | ICD-10-CM | POA: Diagnosis not present

## 2020-11-04 DIAGNOSIS — M25512 Pain in left shoulder: Secondary | ICD-10-CM | POA: Diagnosis not present

## 2020-11-04 DIAGNOSIS — M25612 Stiffness of left shoulder, not elsewhere classified: Secondary | ICD-10-CM | POA: Diagnosis not present

## 2020-11-09 DIAGNOSIS — M542 Cervicalgia: Secondary | ICD-10-CM | POA: Diagnosis not present

## 2020-11-09 DIAGNOSIS — M2560 Stiffness of unspecified joint, not elsewhere classified: Secondary | ICD-10-CM | POA: Diagnosis not present

## 2020-11-09 DIAGNOSIS — M7502 Adhesive capsulitis of left shoulder: Secondary | ICD-10-CM | POA: Diagnosis not present

## 2020-11-09 DIAGNOSIS — M25612 Stiffness of left shoulder, not elsewhere classified: Secondary | ICD-10-CM | POA: Diagnosis not present

## 2020-11-09 DIAGNOSIS — R293 Abnormal posture: Secondary | ICD-10-CM | POA: Diagnosis not present

## 2020-11-09 DIAGNOSIS — M79602 Pain in left arm: Secondary | ICD-10-CM | POA: Diagnosis not present

## 2020-11-09 DIAGNOSIS — M6281 Muscle weakness (generalized): Secondary | ICD-10-CM | POA: Diagnosis not present

## 2020-11-09 DIAGNOSIS — M25512 Pain in left shoulder: Secondary | ICD-10-CM | POA: Diagnosis not present

## 2020-11-11 DIAGNOSIS — M2560 Stiffness of unspecified joint, not elsewhere classified: Secondary | ICD-10-CM | POA: Diagnosis not present

## 2020-11-11 DIAGNOSIS — M79602 Pain in left arm: Secondary | ICD-10-CM | POA: Diagnosis not present

## 2020-11-11 DIAGNOSIS — M25512 Pain in left shoulder: Secondary | ICD-10-CM | POA: Diagnosis not present

## 2020-11-11 DIAGNOSIS — M7502 Adhesive capsulitis of left shoulder: Secondary | ICD-10-CM | POA: Diagnosis not present

## 2020-11-11 DIAGNOSIS — M6281 Muscle weakness (generalized): Secondary | ICD-10-CM | POA: Diagnosis not present

## 2020-11-11 DIAGNOSIS — R293 Abnormal posture: Secondary | ICD-10-CM | POA: Diagnosis not present

## 2020-11-11 DIAGNOSIS — M25612 Stiffness of left shoulder, not elsewhere classified: Secondary | ICD-10-CM | POA: Diagnosis not present

## 2020-11-11 DIAGNOSIS — M542 Cervicalgia: Secondary | ICD-10-CM | POA: Diagnosis not present

## 2020-11-15 DIAGNOSIS — M79602 Pain in left arm: Secondary | ICD-10-CM | POA: Diagnosis not present

## 2020-11-15 DIAGNOSIS — M7502 Adhesive capsulitis of left shoulder: Secondary | ICD-10-CM | POA: Diagnosis not present

## 2020-11-15 DIAGNOSIS — R293 Abnormal posture: Secondary | ICD-10-CM | POA: Diagnosis not present

## 2020-11-15 DIAGNOSIS — M25612 Stiffness of left shoulder, not elsewhere classified: Secondary | ICD-10-CM | POA: Diagnosis not present

## 2020-11-15 DIAGNOSIS — M2560 Stiffness of unspecified joint, not elsewhere classified: Secondary | ICD-10-CM | POA: Diagnosis not present

## 2020-11-15 DIAGNOSIS — M6281 Muscle weakness (generalized): Secondary | ICD-10-CM | POA: Diagnosis not present

## 2020-11-15 DIAGNOSIS — M25512 Pain in left shoulder: Secondary | ICD-10-CM | POA: Diagnosis not present

## 2020-11-15 DIAGNOSIS — M542 Cervicalgia: Secondary | ICD-10-CM | POA: Diagnosis not present

## 2020-11-30 DIAGNOSIS — M79602 Pain in left arm: Secondary | ICD-10-CM | POA: Diagnosis not present

## 2020-11-30 DIAGNOSIS — M542 Cervicalgia: Secondary | ICD-10-CM | POA: Diagnosis not present

## 2020-11-30 DIAGNOSIS — M25512 Pain in left shoulder: Secondary | ICD-10-CM | POA: Diagnosis not present

## 2020-11-30 DIAGNOSIS — R293 Abnormal posture: Secondary | ICD-10-CM | POA: Diagnosis not present

## 2020-11-30 DIAGNOSIS — M6281 Muscle weakness (generalized): Secondary | ICD-10-CM | POA: Diagnosis not present

## 2020-11-30 DIAGNOSIS — M2560 Stiffness of unspecified joint, not elsewhere classified: Secondary | ICD-10-CM | POA: Diagnosis not present

## 2020-11-30 DIAGNOSIS — M25612 Stiffness of left shoulder, not elsewhere classified: Secondary | ICD-10-CM | POA: Diagnosis not present

## 2020-11-30 DIAGNOSIS — M7502 Adhesive capsulitis of left shoulder: Secondary | ICD-10-CM | POA: Diagnosis not present

## 2020-12-03 DIAGNOSIS — Z681 Body mass index (BMI) 19 or less, adult: Secondary | ICD-10-CM | POA: Diagnosis not present

## 2020-12-03 DIAGNOSIS — F410 Panic disorder [episodic paroxysmal anxiety] without agoraphobia: Secondary | ICD-10-CM | POA: Diagnosis not present

## 2020-12-03 DIAGNOSIS — E785 Hyperlipidemia, unspecified: Secondary | ICD-10-CM | POA: Diagnosis not present

## 2021-03-04 DIAGNOSIS — R300 Dysuria: Secondary | ICD-10-CM | POA: Diagnosis not present

## 2021-03-04 DIAGNOSIS — Z6820 Body mass index (BMI) 20.0-20.9, adult: Secondary | ICD-10-CM | POA: Diagnosis not present

## 2021-03-04 IMAGING — DX DG CHEST 1V PORT
1 series · 1 of 1 positions shown · non-contrast
Comparison: 04/13/2020

CLINICAL DATA: Pneumothorax

EXAM:
PORTABLE CHEST 1 VIEW

[chest ap]
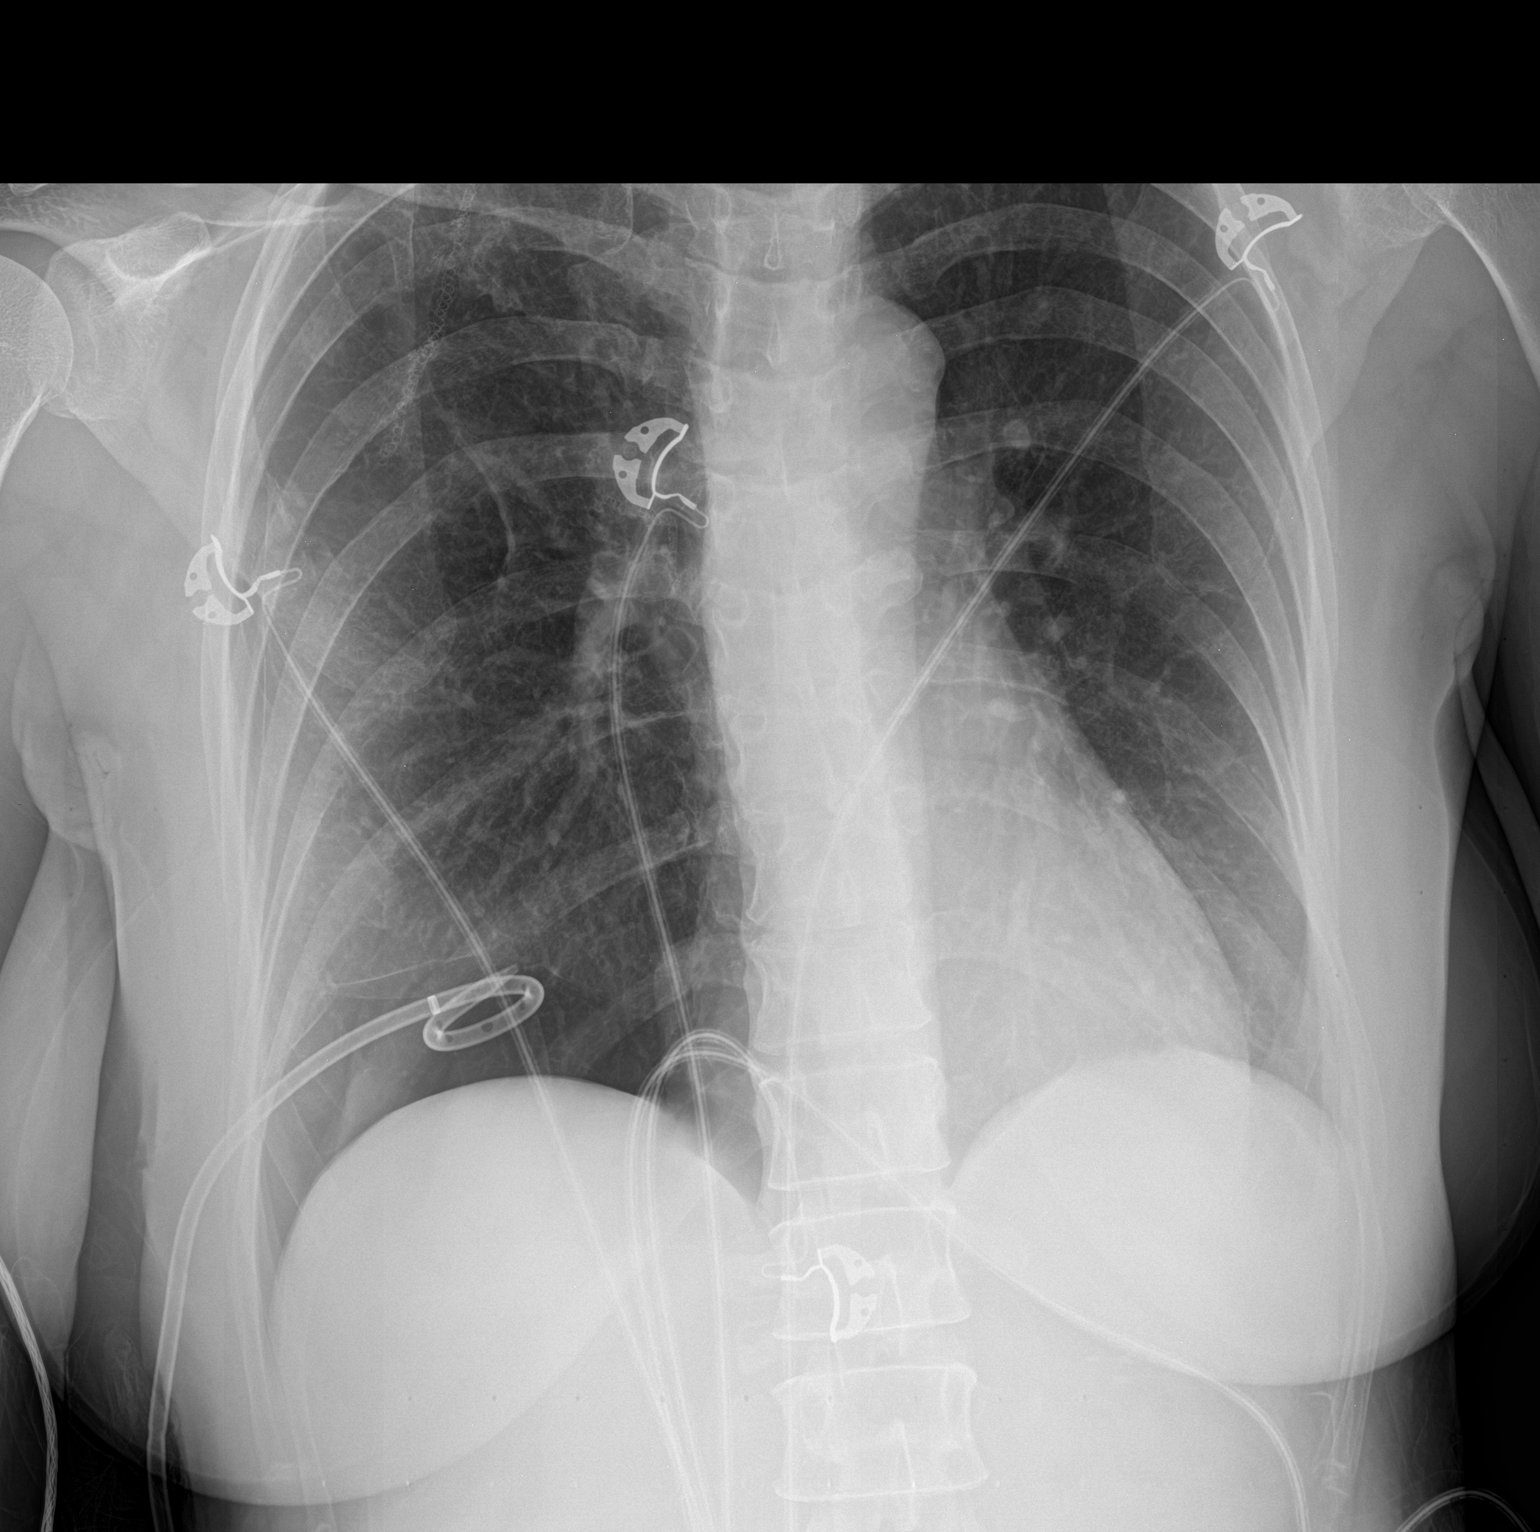

[1 of 1 positions shown; findings below may reference images not displayed]

FINDINGS: The lungs appear mildly hyperinflated, similar to that noted on
prior examination. Surgical staple line noted at the right lung
apex. Right basilar pigtail chest tube is unchanged in position.
Small right basilar pneumothorax has increased in size. Lungs are
clear. No pneumothorax or pleural effusion on the left. Cardiac size
within normal limits. No acute bone abnormality.
IMPRESSION: Enlarging right basilar pneumothorax. Right chest tube in place.
Correlation for right chest tube clamping or malfunction is
recommended.

## 2021-03-07 IMAGING — CR DG CHEST 2V
2 series · 2 of 2 positions shown · non-contrast
Comparison: April 16, 2020

CLINICAL DATA: History of pneumothorax.  Chest tube removal.

EXAM:
CHEST - 2 VIEW

[chest pa]
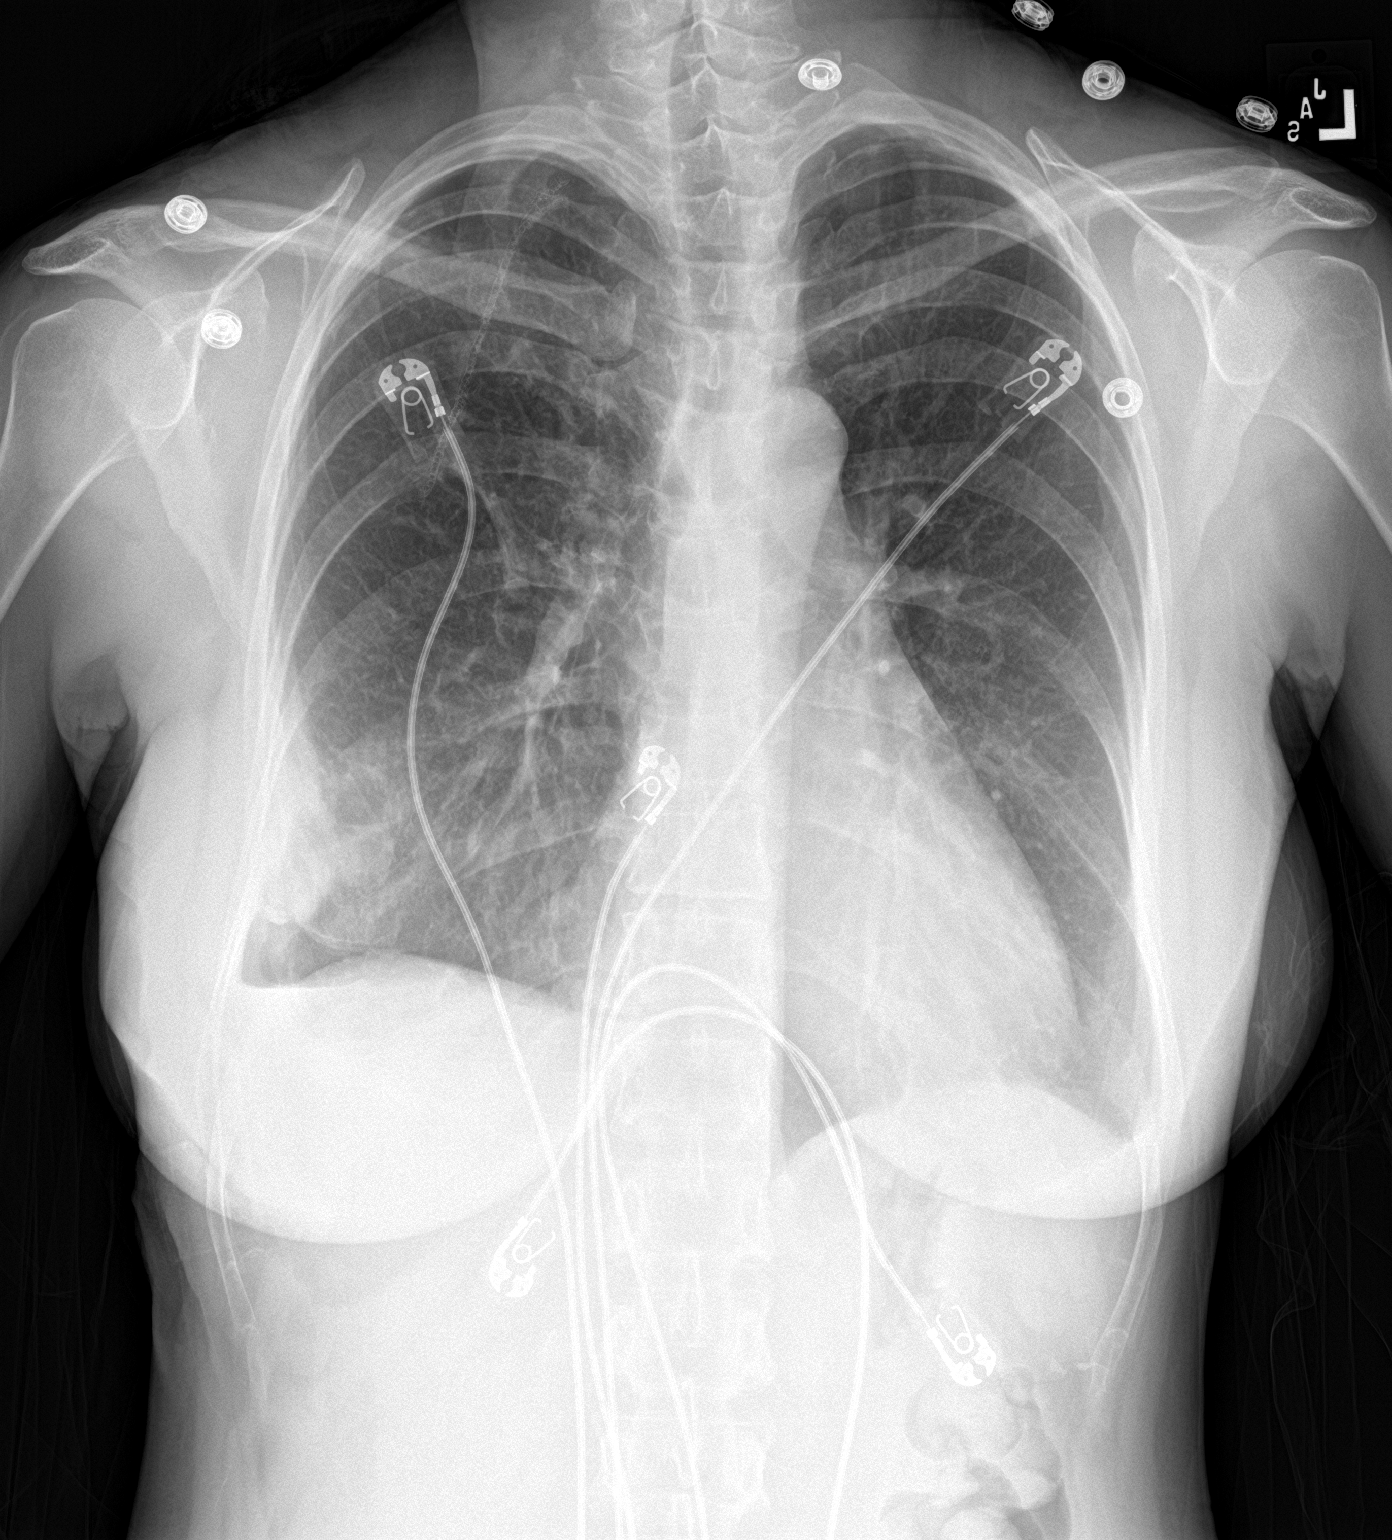

[chest lat]
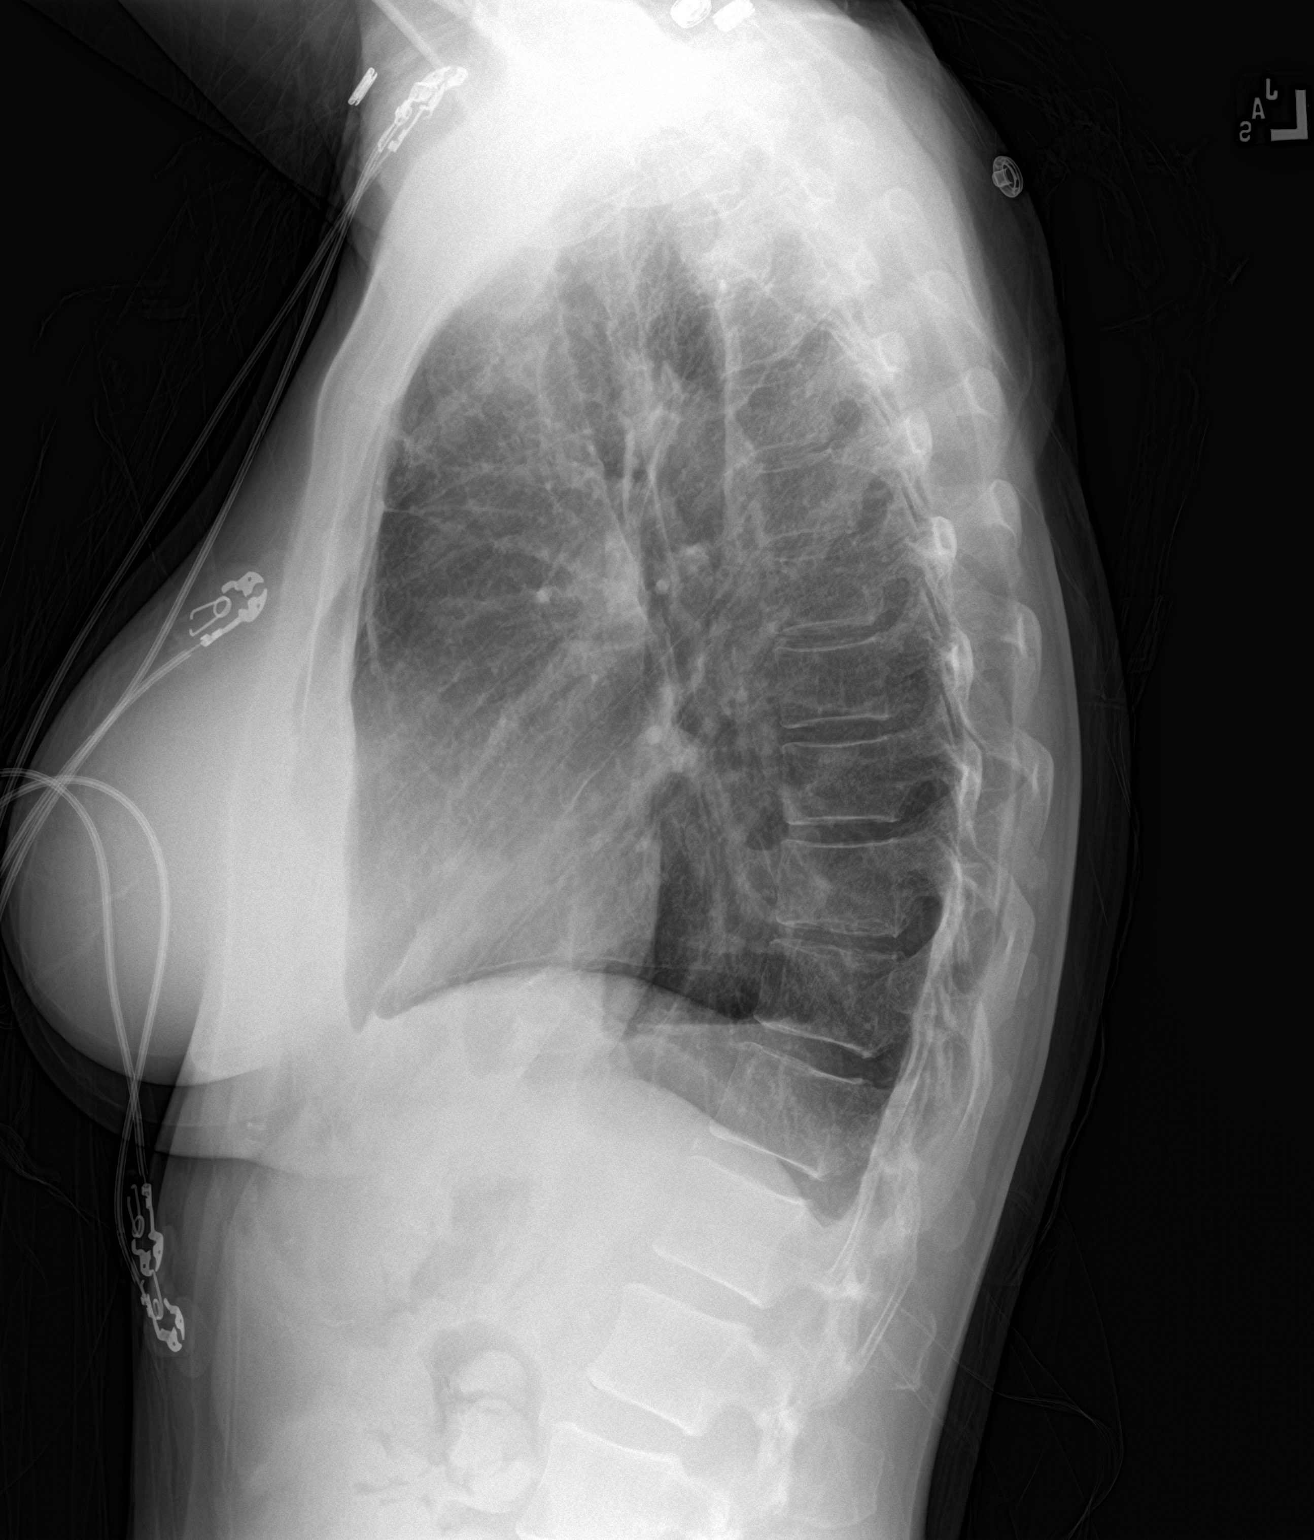

[2 of 2 positions shown; findings below may reference images not displayed]

FINDINGS: The right chest tube is been removed. No pneumothorax. Surgical
changes in the right apex are stable. Opacity in the periphery of
the right lower lung is stable. The cardiomediastinal silhouette is
unchanged. Minimal atelectasis in the left base. The left lung is
otherwise normal. No other acute abnormalities.
IMPRESSION: 1. Interval removal of right chest tube without pneumothorax. No
other changes. Persistent opacity in the periphery of the right
lower lung with a probable small effusion. Persistent mild
atelectasis in the left base. Persistent postsurgical changes in the
right apex.

## 2021-05-02 DIAGNOSIS — Z1231 Encounter for screening mammogram for malignant neoplasm of breast: Secondary | ICD-10-CM | POA: Diagnosis not present

## 2021-05-02 DIAGNOSIS — Z01419 Encounter for gynecological examination (general) (routine) without abnormal findings: Secondary | ICD-10-CM | POA: Diagnosis not present

## 2021-05-07 DIAGNOSIS — J40 Bronchitis, not specified as acute or chronic: Secondary | ICD-10-CM | POA: Diagnosis not present

## 2021-07-14 DIAGNOSIS — R140 Abdominal distension (gaseous): Secondary | ICD-10-CM | POA: Diagnosis not present

## 2021-07-22 DIAGNOSIS — Z1231 Encounter for screening mammogram for malignant neoplasm of breast: Secondary | ICD-10-CM | POA: Diagnosis not present

## 2021-08-22 DIAGNOSIS — R300 Dysuria: Secondary | ICD-10-CM | POA: Diagnosis not present

## 2021-09-20 DIAGNOSIS — E7800 Pure hypercholesterolemia, unspecified: Secondary | ICD-10-CM | POA: Diagnosis not present

## 2021-09-20 DIAGNOSIS — Z1211 Encounter for screening for malignant neoplasm of colon: Secondary | ICD-10-CM | POA: Diagnosis not present

## 2021-09-20 DIAGNOSIS — Z79899 Other long term (current) drug therapy: Secondary | ICD-10-CM | POA: Diagnosis not present

## 2021-09-20 DIAGNOSIS — J45909 Unspecified asthma, uncomplicated: Secondary | ICD-10-CM | POA: Diagnosis not present

## 2021-09-22 DIAGNOSIS — R6889 Other general symptoms and signs: Secondary | ICD-10-CM | POA: Diagnosis not present

## 2021-09-22 DIAGNOSIS — Z6820 Body mass index (BMI) 20.0-20.9, adult: Secondary | ICD-10-CM | POA: Diagnosis not present

## 2021-09-22 DIAGNOSIS — Z1331 Encounter for screening for depression: Secondary | ICD-10-CM | POA: Diagnosis not present

## 2021-09-22 DIAGNOSIS — E785 Hyperlipidemia, unspecified: Secondary | ICD-10-CM | POA: Diagnosis not present

## 2021-12-06 DIAGNOSIS — E785 Hyperlipidemia, unspecified: Secondary | ICD-10-CM | POA: Diagnosis not present

## 2021-12-06 DIAGNOSIS — F410 Panic disorder [episodic paroxysmal anxiety] without agoraphobia: Secondary | ICD-10-CM | POA: Diagnosis not present

## 2021-12-06 DIAGNOSIS — Z6820 Body mass index (BMI) 20.0-20.9, adult: Secondary | ICD-10-CM | POA: Diagnosis not present

## 2021-12-06 DIAGNOSIS — Z1331 Encounter for screening for depression: Secondary | ICD-10-CM | POA: Diagnosis not present

## 2022-04-24 DIAGNOSIS — J349 Unspecified disorder of nose and nasal sinuses: Secondary | ICD-10-CM | POA: Diagnosis not present

## 2022-04-24 DIAGNOSIS — J069 Acute upper respiratory infection, unspecified: Secondary | ICD-10-CM | POA: Diagnosis not present

## 2022-05-09 DIAGNOSIS — D2239 Melanocytic nevi of other parts of face: Secondary | ICD-10-CM | POA: Diagnosis not present

## 2022-05-09 DIAGNOSIS — L821 Other seborrheic keratosis: Secondary | ICD-10-CM | POA: Diagnosis not present

## 2022-05-09 DIAGNOSIS — D225 Melanocytic nevi of trunk: Secondary | ICD-10-CM | POA: Diagnosis not present

## 2022-05-09 DIAGNOSIS — C44629 Squamous cell carcinoma of skin of left upper limb, including shoulder: Secondary | ICD-10-CM | POA: Diagnosis not present

## 2022-05-09 DIAGNOSIS — L578 Other skin changes due to chronic exposure to nonionizing radiation: Secondary | ICD-10-CM | POA: Diagnosis not present

## 2022-05-09 DIAGNOSIS — C44319 Basal cell carcinoma of skin of other parts of face: Secondary | ICD-10-CM | POA: Diagnosis not present

## 2022-05-12 DIAGNOSIS — Z01419 Encounter for gynecological examination (general) (routine) without abnormal findings: Secondary | ICD-10-CM | POA: Diagnosis not present

## 2022-05-12 DIAGNOSIS — Z1231 Encounter for screening mammogram for malignant neoplasm of breast: Secondary | ICD-10-CM | POA: Diagnosis not present

## 2022-06-13 DIAGNOSIS — C44319 Basal cell carcinoma of skin of other parts of face: Secondary | ICD-10-CM | POA: Diagnosis not present

## 2022-06-13 DIAGNOSIS — C4401 Basal cell carcinoma of skin of lip: Secondary | ICD-10-CM | POA: Diagnosis not present

## 2022-07-04 DIAGNOSIS — C44619 Basal cell carcinoma of skin of left upper limb, including shoulder: Secondary | ICD-10-CM | POA: Diagnosis not present

## 2022-08-15 DIAGNOSIS — Z1231 Encounter for screening mammogram for malignant neoplasm of breast: Secondary | ICD-10-CM | POA: Diagnosis not present

## 2022-10-04 DIAGNOSIS — H5213 Myopia, bilateral: Secondary | ICD-10-CM | POA: Diagnosis not present

## 2022-12-04 DIAGNOSIS — R350 Frequency of micturition: Secondary | ICD-10-CM | POA: Diagnosis not present

## 2022-12-04 DIAGNOSIS — R300 Dysuria: Secondary | ICD-10-CM | POA: Diagnosis not present

## 2022-12-26 DIAGNOSIS — Z6822 Body mass index (BMI) 22.0-22.9, adult: Secondary | ICD-10-CM | POA: Diagnosis not present

## 2022-12-26 DIAGNOSIS — E785 Hyperlipidemia, unspecified: Secondary | ICD-10-CM | POA: Diagnosis not present

## 2022-12-26 DIAGNOSIS — F410 Panic disorder [episodic paroxysmal anxiety] without agoraphobia: Secondary | ICD-10-CM | POA: Diagnosis not present

## 2023-07-24 DIAGNOSIS — L578 Other skin changes due to chronic exposure to nonionizing radiation: Secondary | ICD-10-CM | POA: Diagnosis not present

## 2023-07-24 DIAGNOSIS — D2239 Melanocytic nevi of other parts of face: Secondary | ICD-10-CM | POA: Diagnosis not present

## 2023-07-24 DIAGNOSIS — L814 Other melanin hyperpigmentation: Secondary | ICD-10-CM | POA: Diagnosis not present

## 2023-07-24 DIAGNOSIS — L821 Other seborrheic keratosis: Secondary | ICD-10-CM | POA: Diagnosis not present

## 2023-07-24 DIAGNOSIS — D485 Neoplasm of uncertain behavior of skin: Secondary | ICD-10-CM | POA: Diagnosis not present

## 2023-08-22 DIAGNOSIS — D485 Neoplasm of uncertain behavior of skin: Secondary | ICD-10-CM | POA: Diagnosis not present

## 2023-08-29 DIAGNOSIS — Z1231 Encounter for screening mammogram for malignant neoplasm of breast: Secondary | ICD-10-CM | POA: Diagnosis not present

## 2023-09-03 DIAGNOSIS — Z6821 Body mass index (BMI) 21.0-21.9, adult: Secondary | ICD-10-CM | POA: Diagnosis not present

## 2023-09-03 DIAGNOSIS — K253 Acute gastric ulcer without hemorrhage or perforation: Secondary | ICD-10-CM | POA: Diagnosis not present

## 2023-10-17 DIAGNOSIS — G8929 Other chronic pain: Secondary | ICD-10-CM | POA: Diagnosis not present

## 2023-10-17 DIAGNOSIS — M7501 Adhesive capsulitis of right shoulder: Secondary | ICD-10-CM | POA: Diagnosis not present

## 2023-10-17 DIAGNOSIS — M25511 Pain in right shoulder: Secondary | ICD-10-CM | POA: Diagnosis not present

## 2023-10-25 DIAGNOSIS — M6281 Muscle weakness (generalized): Secondary | ICD-10-CM | POA: Diagnosis not present

## 2023-10-25 DIAGNOSIS — M25511 Pain in right shoulder: Secondary | ICD-10-CM | POA: Diagnosis not present

## 2023-10-25 DIAGNOSIS — M7501 Adhesive capsulitis of right shoulder: Secondary | ICD-10-CM | POA: Diagnosis not present

## 2023-11-01 DIAGNOSIS — M7501 Adhesive capsulitis of right shoulder: Secondary | ICD-10-CM | POA: Diagnosis not present

## 2023-11-01 DIAGNOSIS — M25511 Pain in right shoulder: Secondary | ICD-10-CM | POA: Diagnosis not present

## 2023-11-01 DIAGNOSIS — M6281 Muscle weakness (generalized): Secondary | ICD-10-CM | POA: Diagnosis not present

## 2023-11-08 DIAGNOSIS — M7501 Adhesive capsulitis of right shoulder: Secondary | ICD-10-CM | POA: Diagnosis not present

## 2023-11-08 DIAGNOSIS — M25511 Pain in right shoulder: Secondary | ICD-10-CM | POA: Diagnosis not present

## 2023-11-08 DIAGNOSIS — M6281 Muscle weakness (generalized): Secondary | ICD-10-CM | POA: Diagnosis not present

## 2023-11-27 DIAGNOSIS — R194 Change in bowel habit: Secondary | ICD-10-CM | POA: Diagnosis not present

## 2023-11-27 DIAGNOSIS — K219 Gastro-esophageal reflux disease without esophagitis: Secondary | ICD-10-CM | POA: Diagnosis not present

## 2023-11-27 DIAGNOSIS — R1013 Epigastric pain: Secondary | ICD-10-CM | POA: Diagnosis not present

## 2024-01-09 DIAGNOSIS — H5213 Myopia, bilateral: Secondary | ICD-10-CM | POA: Diagnosis not present

## 2024-02-07 DIAGNOSIS — R194 Change in bowel habit: Secondary | ICD-10-CM | POA: Diagnosis not present

## 2024-02-25 DIAGNOSIS — Z1339 Encounter for screening examination for other mental health and behavioral disorders: Secondary | ICD-10-CM | POA: Diagnosis not present

## 2024-02-25 DIAGNOSIS — Z1331 Encounter for screening for depression: Secondary | ICD-10-CM | POA: Diagnosis not present

## 2024-02-25 DIAGNOSIS — Z0000 Encounter for general adult medical examination without abnormal findings: Secondary | ICD-10-CM | POA: Diagnosis not present

## 2024-02-25 DIAGNOSIS — Z13220 Encounter for screening for lipoid disorders: Secondary | ICD-10-CM | POA: Diagnosis not present

## 2024-02-25 DIAGNOSIS — Z6821 Body mass index (BMI) 21.0-21.9, adult: Secondary | ICD-10-CM | POA: Diagnosis not present
